# Patient Record
Sex: Female | Born: 1960 | Race: Black or African American | Hispanic: No | Marital: Married | Smoking: Former smoker
Health system: Southern US, Community
[De-identification: ages and names within clinical notes are randomized; demographics above are authoritative.]

## PROBLEM LIST (undated history)

## (undated) DIAGNOSIS — D649 Anemia, unspecified: Secondary | ICD-10-CM

## (undated) DIAGNOSIS — IMO0001 Reserved for inherently not codable concepts without codable children: Secondary | ICD-10-CM

## (undated) DIAGNOSIS — I428 Other cardiomyopathies: Secondary | ICD-10-CM

## (undated) DIAGNOSIS — Z87891 Personal history of nicotine dependence: Secondary | ICD-10-CM

## (undated) DIAGNOSIS — I5022 Chronic systolic (congestive) heart failure: Secondary | ICD-10-CM

## (undated) DIAGNOSIS — I10 Essential (primary) hypertension: Secondary | ICD-10-CM

## (undated) DIAGNOSIS — I480 Paroxysmal atrial fibrillation: Secondary | ICD-10-CM

## (undated) HISTORY — DX: Personal history of nicotine dependence: Z87.891

## (undated) HISTORY — DX: Anemia, unspecified: D64.9

## (undated) HISTORY — DX: Chronic systolic (congestive) heart failure: I50.22

## (undated) HISTORY — PX: LAPAROSCOPIC GASTRIC SLEEVE RESECTION: SHX5895

## (undated) HISTORY — DX: Morbid (severe) obesity due to excess calories: E66.01

## (undated) HISTORY — DX: Essential (primary) hypertension: I10

---

## 2013-06-27 ENCOUNTER — Other Ambulatory Visit: Payer: Self-pay

## 2013-06-27 DIAGNOSIS — Z1231 Encounter for screening mammogram for malignant neoplasm of breast: Secondary | ICD-10-CM

## 2013-06-30 ENCOUNTER — Ambulatory Visit
Admission: RE | Admit: 2013-06-30 | Discharge: 2013-06-30 | Disposition: A | Discharge status: Home / Self Care | Payer: No Typology Code available for payment source | Source: Ambulatory Visit

## 2013-06-30 ENCOUNTER — Encounter (INDEPENDENT_AMBULATORY_CARE_PROVIDER_SITE_OTHER): Payer: Self-pay

## 2013-06-30 DIAGNOSIS — Z1231 Encounter for screening mammogram for malignant neoplasm of breast: Secondary | ICD-10-CM

## 2014-12-10 ENCOUNTER — Encounter (HOSPITAL_COMMUNITY): Payer: Self-pay | Admitting: *Deleted

## 2014-12-10 ENCOUNTER — Observation Stay (HOSPITAL_COMMUNITY)
Admission: EM | Admit: 2014-12-10 | Discharge: 2014-12-11 | Disposition: A | Discharge status: Home / Self Care | Payer: BLUE CROSS/BLUE SHIELD | Attending: Cardiovascular Disease | Admitting: Cardiovascular Disease

## 2014-12-10 ENCOUNTER — Emergency Department (HOSPITAL_COMMUNITY): Payer: BLUE CROSS/BLUE SHIELD

## 2014-12-10 DIAGNOSIS — E669 Obesity, unspecified: Secondary | ICD-10-CM | POA: Diagnosis not present

## 2014-12-10 DIAGNOSIS — I429 Cardiomyopathy, unspecified: Secondary | ICD-10-CM | POA: Diagnosis not present

## 2014-12-10 DIAGNOSIS — I5022 Chronic systolic (congestive) heart failure: Secondary | ICD-10-CM | POA: Diagnosis not present

## 2014-12-10 DIAGNOSIS — I4891 Unspecified atrial fibrillation: Secondary | ICD-10-CM

## 2014-12-10 DIAGNOSIS — I480 Paroxysmal atrial fibrillation: Principal | ICD-10-CM | POA: Insufficient documentation

## 2014-12-10 DIAGNOSIS — I428 Other cardiomyopathies: Secondary | ICD-10-CM | POA: Diagnosis not present

## 2014-12-10 DIAGNOSIS — Z720 Tobacco use: Secondary | ICD-10-CM

## 2014-12-10 DIAGNOSIS — E876 Hypokalemia: Secondary | ICD-10-CM | POA: Diagnosis not present

## 2014-12-10 DIAGNOSIS — I110 Hypertensive heart disease with heart failure: Secondary | ICD-10-CM | POA: Diagnosis not present

## 2014-12-10 DIAGNOSIS — Z7982 Long term (current) use of aspirin: Secondary | ICD-10-CM | POA: Diagnosis not present

## 2014-12-10 DIAGNOSIS — Z6841 Body Mass Index (BMI) 40.0 and over, adult: Secondary | ICD-10-CM | POA: Diagnosis not present

## 2014-12-10 DIAGNOSIS — I10 Essential (primary) hypertension: Secondary | ICD-10-CM | POA: Diagnosis not present

## 2014-12-10 HISTORY — DX: Paroxysmal atrial fibrillation: I48.0

## 2014-12-10 HISTORY — DX: Other cardiomyopathies: I42.8

## 2014-12-10 LAB — BASIC METABOLIC PANEL
Anion gap: 6 (ref 5–15)
BUN: 16 mg/dL (ref 6–20)
CALCIUM: 8.4 mg/dL — AB (ref 8.9–10.3)
CO2: 27 mmol/L (ref 22–32)
CREATININE: 0.78 mg/dL (ref 0.44–1.00)
Chloride: 102 mmol/L (ref 101–111)
GFR calc non Af Amer: >60 mL/min (ref >60)
Glucose, Bld: 133 mg/dL — ABNORMAL HIGH (ref 65–99)
Potassium: 3 mmol/L — ABNORMAL LOW (ref 3.5–5.1)
SODIUM: 135 mmol/L (ref 135–145)

## 2014-12-10 LAB — CBC
HCT: 35 % — ABNORMAL LOW (ref 36.0–46.0)
Hemoglobin: 10.9 g/dL — ABNORMAL LOW (ref 12.0–15.0)
MCH: 24.8 pg — ABNORMAL LOW (ref 26.0–34.0)
MCHC: 31.1 g/dL (ref 30.0–36.0)
MCV: 79.7 fL (ref 78.0–100.0)
PLATELETS: 174 10*3/uL (ref 150–400)
RBC: 4.39 MIL/uL (ref 3.87–5.11)
RDW: 15.6 % — AB (ref 11.5–15.5)
WBC: 5.9 10*3/uL (ref 4.0–10.5)

## 2014-12-10 LAB — TROPONIN I

## 2014-12-10 LAB — TSH: TSH: 1.188 u[IU]/mL (ref 0.350–4.500)

## 2014-12-10 LAB — BRAIN NATRIURETIC PEPTIDE: B NATRIURETIC PEPTIDE 5: 492.6 pg/mL — AB (ref 0.0–100.0)

## 2014-12-10 LAB — I-STAT TROPONIN, ED: TROPONIN I, POC: 0.01 ng/mL (ref 0.00–0.08)

## 2014-12-10 MED ORDER — POTASSIUM CHLORIDE CRYS ER 20 MEQ PO TBCR
40.0000 meq | EXTENDED_RELEASE_TABLET | Freq: Once | ORAL | Status: AC
Start: 2014-12-10 — End: 2014-12-10
  Administered 2014-12-10: 40 meq via ORAL
  Filled 2014-12-10: qty 2

## 2014-12-10 MED ORDER — APIXABAN 5 MG PO TABS
5.0000 mg | ORAL_TABLET | Freq: Two times a day (BID) | ORAL | Status: DC
  Administered 2014-12-10 – 2014-12-11 (×3): 5 mg via ORAL
  Filled 2014-12-10 (×3): qty 1

## 2014-12-10 MED ORDER — ACETAMINOPHEN 325 MG PO TABS
650.0000 mg | ORAL_TABLET | ORAL | Status: DC | PRN
  Administered 2014-12-10: 650 mg via ORAL
  Filled 2014-12-10: qty 2

## 2014-12-10 MED ORDER — DILTIAZEM HCL 100 MG IV SOLR: 5.0000 mg/h | INTRAVENOUS | Status: DC

## 2014-12-10 MED ORDER — ONDANSETRON HCL 4 MG/2ML IJ SOLN: 4.0000 mg | Freq: Four times a day (QID) | INTRAMUSCULAR | Status: DC | PRN

## 2014-12-10 MED ORDER — DILTIAZEM LOAD VIA INFUSION
10.0000 mg | Freq: Once | INTRAVENOUS | Status: AC
  Administered 2014-12-10: 10 mg via INTRAVENOUS
  Filled 2014-12-10: qty 10

## 2014-12-10 MED ORDER — DILTIAZEM HCL 100 MG IV SOLR
5.0000 mg/h | INTRAVENOUS | Status: DC
  Administered 2014-12-10: 15 mg/h via INTRAVENOUS
  Administered 2014-12-10: 5 mg/h via INTRAVENOUS
  Filled 2014-12-10 (×4): qty 100

## 2014-12-10 NOTE — H&P (Signed)
Patient ID: Wanda Trujillo MRN: [id number], DOB/AGE: [birth year]   Admit date: 12/10/2014   Primary Physician: Egbert Garibaldi, NP Primary Cardiologist: New Reason for consult: new onset atrial fibrillation   Pt. Profile:  Wanda Trujillo is a 54 y.o. female with a history of systolic CHF (dx in 2010 in [place]), HTN, and obesity who presented to [facility] today with palpitations, chest tightness and SOB. She was found to be in afb with RVR. This is new onset.   She is originally from [place] and moved to [place] a couple of years ago. She established with a PCP: Dr. Fredrik Cove but not cardiology. She reports a history of NICM diagnosed in 2010 and long term therapy with lisinopril and coreg. She also took lasix for a short period of time but no longer takes it. She was a smoker but quit at the time of her CHF diagnosis. She also reports having had stress tests with no blockages. She denies ever having a cardiac catheterization.   She lost her job and insurance and was no longer able to get her ACE and BB. She was off them for two months. She recently got a new job and had an appointment to see her PCP tomorrow for refills. She was in her usual state of health until this past Friday when she was out drinking and smoking Hookah. While she was taking a drag of the hookah she had sudden on set of chest palpations. This was associated with chest tightness and SOB. Since Friday night she has had intermittent chest tightness, SOB and palpations prompting her to present to the ED today where she was found to be in afib with RVR. She was placed on a dilt gtt for rate control. This has helped her significantly but she still feels mildly SOB. No LE edema, PND or orthopnea. She does admit to exertional dyspnea lately.   Problem List  Past Medical History  Diagnosis Date  . CHF (congestive heart failure) (HCC)   . Hypertension     History reviewed. No pertinent past surgical history.    Allergies  No Known Allergies   Home Medications  Prior to Admission medications   Medication Sig Start Date End Date Taking? Authorizing Provider  aspirin 325 MG tablet Take 325 mg by mouth every 4 (four) hours as needed for mild pain or moderate pain.   Yes Historical Provider, MD    Family History  History reviewed. No pertinent family history. No family status information on file.     Social History  Social History   Social History  . Marital Status: Married    Spouse Name: N/A  . Number of Children: N/A  . Years of Education: N/A   Occupational History  . Not on file.   Social History Main Topics  . Smoking status: Never Smoker   . Smokeless tobacco: Not on file  . Alcohol Use: Yes     Comment: occ  . Drug Use: No  . Sexual Activity: Not on file   Other Topics Concern  . Not on file   Social History Narrative  . No narrative on file    . All other systems reviewed and are otherwise negative except as noted above.  Physical Exam  Blood pressure 133/78, pulse 115, temperature 97.9 F (36.6 C), temperature source Oral, resp. rate 17, height  (1.651 m), weight 247 lb (112.038 kg), last menstrual period 11/25/2014, SpO2 100 %.  General: Pleasant, NAD, obese Psych: Normal  affect. Neuro: Alert and oriented X 3. Moves all extremities spontaneously. HEENT: Normal  Neck: Supple without bruits or JVD. Lungs:  Resp regular and unlabored, CTA. Heart: irreg irreg no s3, s4, or murmurs. Abdomen: Soft, non-tender, non-distended, BS + x 4.  Extremities: No clubbing, cyanosis or edema. DP/PT/Radials 2+ and equal bilaterally.  Labs  No results for input(s): CKTOTAL, CKMB, TROPONINI in the last 72 hours. Lab Results  Component Value Date   WBC 5.9 12/10/2014   HGB 10.9* 12/10/2014   HCT 35.0* 12/10/2014   MCV 79.7 12/10/2014   PLT 174 12/10/2014    Recent Labs Lab 12/10/14 0729  NA 135  K 3.0*  CL 102  CO2 27  BUN 16  CREATININE 0.78   CALCIUM 8.4*  GLUCOSE 133*     Radiology/Studies  Dg Chest Portable 1 View  12/10/2014  CLINICAL DATA:  54 year old female with mid chest tightness and shortness of breath for the past 2 days. Headache and dizziness. EXAM: PORTABLE CHEST 1 VIEW COMPARISON:  No priors. FINDINGS: Lung volumes are normal. No consolidative airspace disease. No pleural effusions. No evidence of pulmonary edema. No pneumothorax. No suspicious appearing pulmonary nodules or masses. Mild cardiomegaly. The patient is rotated to the right on today's exam, resulting in distortion of the mediastinal contours and reduced diagnostic sensitivity and specificity for mediastinal pathology. IMPRESSION: 1. No radiographic evidence of acute cardiopulmonary disease. 2. Mild cardiomegaly. Electronically Signed   By: Trudie Reed M.D.   On: 12/10/2014 08:01    ECG  HR 133 Atrial fibrillation with rapid ventricular response  ASSESSMENT AND PLAN  Wanda Trujillo is a 54 y.o. female with a history of systolic CHF (dx in 2010 in [place]), HTN, and obesity who presented to [facility] today with palpitations, chest tightness and SOB. She was found to be in afb with RVR.  Afib with RVR: -- CHADSVASC score of at least 3 (1 HTN, 1 CHF, 1 Female sex). Will start her on Eliquis for thromboembolic  -- Plan for 2D ECHO when rate controlled or converted into NSR.  -- Will admit to tele. Consider TEE/DCCV tomorrow if she does not spontaneously convert. Will continue IV dilt for now. -- Will obtain a TSH and HgA1c  Hypokalemia- will supplement   HTN- will resume home Lisinopril   Hx of systolic CHF- she appears euvolemic -- Will obtain 2D ECHO as above to assess LV function   Signed, Janetta Hora, PA-C 12/10/2014, 10:07 AM  Pager [phone]  The patient was seen and examined, and I agree with the assessment and plan as documented above, with modifications as noted below. 54 yr old woman with aforementioned history (probable  nonischemic cardiomyopathy) who presents with rapid atrial fibrillation. Palpitations occurred after binge drinking and smoking Hookah. Had been taking meds until she ran out insurance, but recently obtained it again. Has appt with new PCP tomorrow to get med refills.  Currently on diltiazem infusion 10 mg/hr. HR had been in 90 bpm range but elevated after having to use the bathroom. SBP in 120 mmHg range.  RECOMMENDATIONS: Will continue IV diltiazem for now and plan for TEE/cardioversion on 10/17 if still in rapid atrial fibrillation. I discussed thromboembolic risk (CHA2DS2VASC of 3) with her and she agrees to take anticoagulant therapy. Will start apixaban 5 mg bid. K low at 3 (no known h/o hypokalemia) so will replete. Obtain echocardiogram once HR controlled and/or she converts to sinus rhythm.

## 2014-12-10 NOTE — ED Notes (Signed)
Pt reports mid chest tightness and sob since Friday. Also having headache and dizziness. ekg done, afib rvr 130-150.

## 2014-12-10 NOTE — ED Notes (Signed)
Attempted report 

## 2014-12-10 NOTE — ED Notes (Signed)
Cardiology, MD at bedside. 

## 2014-12-10 NOTE — ED Provider Notes (Signed)
CSN: [id number]     Arrival date & time 12/10/14  0712 History   First MD Initiated Contact with Patient 12/10/14 [phone]     Chief Complaint  Patient presents with  . Chest Pain  . Headache     (Consider location/radiation/quality/duration/timing/severity/associated sxs/prior Treatment) HPI Comments: Patient is a 54 year old female with history of hypertension and reported episode of congestive heart failure in 2010 area she presents today with complaints of tightness in her chest, feeling short of breath, and palpitations. This started 2 days ago while she was drinking alcohol and smoking hookah. She denies any fevers or chills. She denies any swelling of her legs. She has been treated in the past with Coreg and lisinopril, however she has been off of these for the past 2 months due to financial reasons.  Patient is a 54 y.o. female presenting with chest pain. The history is provided by the patient.  Chest Pain Pain location:  Substernal area Pain quality: tightness   Pain radiates to:  Does not radiate Pain radiates to the back: no   Pain severity:  Moderate Onset quality:  Sudden Duration:  2 days Timing:  Constant Progression:  Unchanged Chronicity:  New Context: breathing   Relieved by:  Nothing Worsened by:  Nothing tried Ineffective treatments:  None tried Associated symptoms: shortness of breath     Past Medical History  Diagnosis Date  . CHF (congestive heart failure) (HCC)   . Hypertension    History reviewed. No pertinent past surgical history. History reviewed. No pertinent family history. Social History  Substance Use Topics  . Smoking status: Never Smoker   . Smokeless tobacco: None  . Alcohol Use: Yes     Comment: occ   OB History    No data available     Review of Systems  Respiratory: Positive for shortness of breath.   Cardiovascular: Positive for chest pain.  All other systems reviewed and are negative.     Allergies  Review of patient's  allergies indicates no known allergies.  Home Medications   Prior to Admission medications   Not on File   BP 150/106 mmHg  Pulse 133  Temp(Src) 97.9 F (36.6 C) (Oral)  Resp 20  Ht  (1.651 m)  Wt 247 lb (112.038 kg)  BMI 41.10 kg/m2  SpO2 100% Physical Exam  Constitutional: She is oriented to person, place, and time. She appears well-developed and well-nourished. No distress.  HENT:  Head: Normocephalic and atraumatic.  Neck: Normal range of motion. Neck supple.  Cardiovascular: Exam reveals no gallop and no friction rub.   No murmur heard. Heart rate is irregularly irregular and rapid  Pulmonary/Chest: Effort normal and breath sounds normal. No respiratory distress. She has no wheezes. She has no rales.  Abdominal: Soft. Bowel sounds are normal. She exhibits no distension. There is no tenderness.  Musculoskeletal: Normal range of motion. She exhibits no edema.  Neurological: She is alert and oriented to person, place, and time.  Skin: Skin is warm and dry. She is not diaphoretic.  Nursing note and vitals reviewed.   ED Course  Procedures (including critical care time) Labs Review Labs Reviewed  BASIC METABOLIC PANEL  CBC  BRAIN NATRIURETIC PEPTIDE  I-STAT TROPOININ, ED    Imaging Review No results found. I have personally reviewed and evaluated these images and lab results as part of my medical decision-making.   EKG Interpretation   Date/Time:  Sunday December 10 2014 07:20:55 EDT Ventricular Rate:  133  PR Interval:    QRS Duration: 95 QT Interval:  320 QTC Calculation: 476 R Axis:   42 Text Interpretation:  Atrial fibrillation with rapid ventricular response  Confirmed by Mikesha Migliaccio  MD, Kiwan Gadsden ([id number]) on 12/10/2014 7:29:46 AM      MDM   Final diagnoses:  None    Patient presents with complaints of palpitations, tightness in her chest, and shortness of breath she appears to be related to atrial fibrillation with rapid ventricular response. Her  rate was initially in the 140s and was started on a Cardizem drip with good rate control. Laboratory studies reveal an elevated BNP, however chest x-ray reveals no evidence for overt CHF. I've spoken with the cardiologist on call, and the patient will be admitted to the cardiology service under the care of Dr. Purvis Sheffield.  CRITICAL CARE Performed by: Geoffery Lyons Total critical care time: 45 minutes Critical care time was exclusive of separately billable procedures and treating other patients. Critical care was necessary to treat or prevent imminent or life-threatening deterioration. Critical care was time spent personally by me on the following activities: development of treatment plan with patient and/or surrogate as well as nursing, discussions with consultants, evaluation of patient's response to treatment, examination of patient, obtaining history from patient or surrogate, ordering and performing treatments and interventions, ordering and review of laboratory studies, ordering and review of radiographic studies, pulse oximetry and re-evaluation of patient's condition.     Geoffery Lyons, MD 12/10/14 [phone]

## 2014-12-11 ENCOUNTER — Encounter (HOSPITAL_COMMUNITY)
Admission: EM | Disposition: A | Discharge status: Home / Self Care | Payer: Self-pay | Source: Home / Self Care | Attending: Emergency Medicine

## 2014-12-11 ENCOUNTER — Observation Stay (HOSPITAL_BASED_OUTPATIENT_CLINIC_OR_DEPARTMENT_OTHER): Payer: BLUE CROSS/BLUE SHIELD

## 2014-12-11 ENCOUNTER — Telehealth: Payer: Self-pay | Admitting: Physician Assistant

## 2014-12-11 ENCOUNTER — Encounter (HOSPITAL_COMMUNITY): Payer: Self-pay

## 2014-12-11 ENCOUNTER — Observation Stay (HOSPITAL_COMMUNITY): Payer: BLUE CROSS/BLUE SHIELD | Admitting: Anesthesiology

## 2014-12-11 DIAGNOSIS — I428 Other cardiomyopathies: Secondary | ICD-10-CM

## 2014-12-11 DIAGNOSIS — I4891 Unspecified atrial fibrillation: Secondary | ICD-10-CM | POA: Diagnosis not present

## 2014-12-11 DIAGNOSIS — I480 Paroxysmal atrial fibrillation: Secondary | ICD-10-CM | POA: Diagnosis present

## 2014-12-11 HISTORY — PX: CARDIOVERSION: SHX1299

## 2014-12-11 HISTORY — PX: TEE WITHOUT CARDIOVERSION: SHX5443

## 2014-12-11 LAB — PROTIME-INR
INR: 1.13 (ref 0.00–1.49)
PROTHROMBIN TIME: 14.6 s (ref 11.6–15.2)

## 2014-12-11 LAB — BASIC METABOLIC PANEL
ANION GAP: 6 (ref 5–15)
BUN: 9 mg/dL (ref 6–20)
CALCIUM: 8.4 mg/dL — AB (ref 8.9–10.3)
CO2: 25 mmol/L (ref 22–32)
Chloride: 106 mmol/L (ref 101–111)
Creatinine, Ser: 0.58 mg/dL (ref 0.44–1.00)
GFR calc Af Amer: >60 mL/min (ref >60)
Glucose, Bld: 112 mg/dL — ABNORMAL HIGH (ref 65–99)
POTASSIUM: 4.5 mmol/L (ref 3.5–5.1)
SODIUM: 137 mmol/L (ref 135–145)

## 2014-12-11 LAB — LIPID PANEL
CHOL/HDL RATIO: 2.8 ratio
CHOLESTEROL: 159 mg/dL (ref 0–200)
HDL: 57 mg/dL (ref >40)
LDL Cholesterol: 81 mg/dL (ref 0–99)
Triglycerides: 103 mg/dL (ref <150)
VLDL: 21 mg/dL (ref 0–40)

## 2014-12-11 LAB — CBC
HEMATOCRIT: 32.6 % — AB (ref 36.0–46.0)
HEMOGLOBIN: 10.2 g/dL — AB (ref 12.0–15.0)
MCH: 25.1 pg — ABNORMAL LOW (ref 26.0–34.0)
MCHC: 31.3 g/dL (ref 30.0–36.0)
MCV: 80.1 fL (ref 78.0–100.0)
Platelets: 152 10*3/uL (ref 150–400)
RBC: 4.07 MIL/uL (ref 3.87–5.11)
RDW: 15.7 % — AB (ref 11.5–15.5)
WBC: 6 10*3/uL (ref 4.0–10.5)

## 2014-12-11 LAB — HEMOGLOBIN A1C
Hgb A1c MFr Bld: 5.7 % — ABNORMAL HIGH (ref 4.8–5.6)
MEAN PLASMA GLUCOSE: 117 mg/dL

## 2014-12-11 LAB — TROPONIN I: Troponin I: <0.03 ng/mL (ref <0.031)

## 2014-12-11 SURGERY — ECHOCARDIOGRAM, TRANSESOPHAGEAL
Anesthesia: Monitor Anesthesia Care

## 2014-12-11 MED ORDER — DILTIAZEM HCL ER COATED BEADS 180 MG PO CP24
180.0000 mg | ORAL_CAPSULE | Freq: Every day | ORAL | Status: DC
  Administered 2014-12-11: 180 mg via ORAL
  Filled 2014-12-11: qty 1

## 2014-12-11 MED ORDER — PROPOFOL 500 MG/50ML IV EMUL
INTRAVENOUS | Status: DC | PRN
  Administered 2014-12-11: 150 ug/kg/min via INTRAVENOUS

## 2014-12-11 MED ORDER — MIDAZOLAM HCL 5 MG/ML IJ SOLN
INTRAMUSCULAR | Status: AC
  Filled 2014-12-11: qty 2

## 2014-12-11 MED ORDER — DILTIAZEM HCL ER COATED BEADS 180 MG PO CP24: 180.0000 mg | ORAL_CAPSULE | Freq: Every day | ORAL | Status: DC

## 2014-12-11 MED ORDER — BUTAMBEN-TETRACAINE-BENZOCAINE 2-2-14 % EX AERO
INHALATION_SPRAY | CUTANEOUS | Status: DC | PRN
  Administered 2014-12-11: 2 via TOPICAL

## 2014-12-11 MED ORDER — CARVEDILOL 3.125 MG PO TABS: 3.1250 mg | ORAL_TABLET | Freq: Two times a day (BID) | ORAL | Status: DC

## 2014-12-11 MED ORDER — OFF THE BEAT BOOK
Freq: Once | Status: DC
  Filled 2014-12-11: qty 1

## 2014-12-11 MED ORDER — LISINOPRIL 5 MG PO TABS: 5.0000 mg | ORAL_TABLET | Freq: Every day | ORAL | Status: DC

## 2014-12-11 MED ORDER — LACTATED RINGERS IV SOLN
INTRAVENOUS | Status: DC | PRN
  Administered 2014-12-11: 11:00:00 via INTRAVENOUS

## 2014-12-11 MED ORDER — SODIUM CHLORIDE 0.9 % IV SOLN: INTRAVENOUS | Status: DC

## 2014-12-11 MED ORDER — FENTANYL CITRATE (PF) 100 MCG/2ML IJ SOLN
INTRAMUSCULAR | Status: AC
  Filled 2014-12-11: qty 2

## 2014-12-11 MED ORDER — LISINOPRIL 2.5 MG PO TABS: 5.0000 mg | ORAL_TABLET | Freq: Every day | ORAL | Status: DC

## 2014-12-11 MED ORDER — APIXABAN 5 MG PO TABS: 5.0000 mg | ORAL_TABLET | Freq: Two times a day (BID) | ORAL | Status: DC

## 2014-12-11 MED ORDER — PROPOFOL 10 MG/ML IV BOLUS
INTRAVENOUS | Status: DC | PRN
  Administered 2014-12-11 (×3): 20 mg via INTRAVENOUS

## 2014-12-11 NOTE — Transfer of Care (Signed)
Immediate Anesthesia Transfer of Care Note  Patient: Wanda Trujillo  Procedure(s) Performed: Procedure(s): TRANSESOPHAGEAL ECHOCARDIOGRAM (TEE) (N/A) CARDIOVERSION (N/A)  Patient Location: PACU  Anesthesia Type:MAC  Level of Consciousness: awake, oriented and patient cooperative  Airway & Oxygen Therapy: Patient Spontanous Breathing and Patient connected to nasal cannula oxygen  Post-op Assessment: Report given to RN, Post -op Vital signs reviewed and stable and Patient moving all extremities  Post vital signs: Reviewed and stable  Last Vitals:  Filed Vitals:   12/11/14 1133  BP: 147/90  Pulse: 73  Temp:   Resp: 19    Complications: No apparent anesthesia complications

## 2014-12-11 NOTE — Anesthesia Procedure Notes (Signed)
Procedure Name: MAC Date/Time: 12/11/2014 11:10 AM Performed by: Charm Barges, Iceis Knab R Pre-anesthesia Checklist: Patient identified, Emergency Drugs available, Suction available, Patient being monitored and Timeout performed Patient Re-evaluated:Patient Re-evaluated prior to inductionOxygen Delivery Method: Nasal cannula

## 2014-12-11 NOTE — Telephone Encounter (Signed)
TCM per [facility] Course  10/27 @ 10 w/ Kriste Basque

## 2014-12-11 NOTE — Progress Notes (Signed)
Echocardiogram 2D Echocardiogram has been performed.  Wanda Trujillo 12/11/2014, 1:43 PM

## 2014-12-11 NOTE — Procedures (Signed)
Electrical Cardioversion Procedure Note Geralda Frasier [id number] 1960/07/31  Procedure: Electrical Cardioversion Indications:  Atrial Fibrillation  Procedure Details Consent: Risks of procedure as well as the alternatives and risks of each were explained to the (patient/caregiver).  Consent for procedure obtained. Time Out: Verified patient identification, verified procedure, site/side was marked, verified correct patient position, special equipment/implants available, medications/allergies/relevent history reviewed, required imaging and test results available.  Performed  Patient placed on cardiac monitor, pulse oximetry, supplemental oxygen as necessary.  Sedation given: propofol Pacer pads placed anterior and posterior chest.  Cardioverted 1 time(s).  Cardioverted successfully at 150J.  Evaluation Findings: Post procedure EKG shows: NSR Complications: None Patient did tolerate procedure well.   Madilyn Hook, MD 12/11/2014, 6:00 PM

## 2014-12-11 NOTE — Care Management Note (Addendum)
Case Management Note  Patient Details  Name: Wanda Trujillo MRN: [id number] Date of Birth: 05-20-1960  Subjective/Objective:Pt admitted for A Fib. Pt will be d/c with the medication Eliquis.                   Action/Plan: Benefits check in process. Will make pt aware of cost once completed. CM will provide pt with 30 day free and co pay card. MD please write Rx for 30 day free no refills and then the original Rx with refills. No further needs from CM at this time.    Expected Discharge Date:                  Expected Discharge Plan:  Home/Self Care  In-House Referral:  NA  Discharge planning Services  CM Consult, Medication Assistance  Post Acute Care Choice:  NA Choice offered to:  NA  DME Arranged:  N/A DME Agency:  NA  HH Arranged:  NA HH Agency:  NA  Status of Service:     Medicare Important Message Given:    Date Medicare IM Given:    Medicare IM give by:    Date Additional Medicare IM Given:    Additional Medicare Important Message give by:     If discussed at Long Length of Stay Meetings, dates discussed:    Additional Comments:  Tomi Bamberger, RN, BSN [phone]  Eliquis:  Pt copay will be $50-prior auth not required. Pt wants to use [facility] on [address]. CM will call to make sure medication  Is available. CM did call and medication is available. No further needs from CM at this time.   Gala Lewandowsky, RN 12/11/2014, 10:52 AM

## 2014-12-11 NOTE — Progress Notes (Signed)
Patient Name: Wanda Trujillo Date of Encounter: 12/11/2014     Active Problems:   Atrial fibrillation (HCC)    SUBJECTIVE  No CP or SOB. Not feeling back to her baseline   CURRENT MEDS . apixaban  5 mg Oral BID  . off the beat book   Does not apply Once    OBJECTIVE  Filed Vitals:   12/10/14 1401 12/10/14 1544 12/10/14 2240 12/11/14 0533  BP: 145/72 123/64 124/67 129/64  Pulse:   83 50  Temp:   98.5 F (36.9 C) 97.4 F (36.3 C)  TempSrc:   Oral   Resp:   18 16  Height:      Weight:    256 lb 4.8 oz (116.257 kg)  SpO2:   100% 100%   No intake or output data in the 24 hours ending 12/11/14 0743 Filed Weights   12/10/14 0725 12/10/14 1311 12/11/14 0533  Weight: 247 lb (112.038 kg) 253 lb 9.6 oz (115.032 kg) 256 lb 4.8 oz (116.257 kg)    PHYSICAL EXAM   General: Pleasant, NAD, obese Psych: Normal affect. Neuro: Alert and oriented X 3. Moves all extremities spontaneously. HEENT: Normal Neck: Supple without bruits or JVD. Lungs: Resp regular and unlabored, CTA. Heart: irreg irreg no s3, s4, or murmurs. Abdomen: Soft, non-tender, non-distended, BS + x 4.  Extremities: No clubbing, cyanosis or edema. DP/PT/Radials 2+ and equal bilaterally.  Accessory Clinical Findings  CBC  Recent Labs  12/10/14 0729 12/11/14 0114  WBC 5.9 6.0  HGB 10.9* 10.2*  HCT 35.0* 32.6*  MCV 79.7 80.1  PLT 174 152   Basic Metabolic Panel  Recent Labs  12/10/14 0729 12/11/14 0114  NA 135 137  K 3.0* 4.5  CL 102 106  CO2 27 25  GLUCOSE 133* 112*  BUN 16 9  CREATININE 0.78 0.58  CALCIUM 8.4* 8.4*    Cardiac Enzymes  Recent Labs  12/10/14 1520 12/10/14 1902 12/11/14 0114  TROPONINI <0.03 <0.03 <0.03   Fasting Lipid Panel  Recent Labs  12/11/14 0114  CHOL 159  HDL 57  LDLCALC 81  TRIG 103  CHOLHDL 2.8   Thyroid Function Tests  Recent Labs  12/10/14 1520  TSH 1.188    TELE  afib with CVR. Some bradycardia overnight w/ up to 2.15  second pauses.   Radiology/Studies  Dg Chest Portable 1 View  12/10/2014  CLINICAL DATA:  54 year old female with mid chest tightness and shortness of breath for the past 2 days. Headache and dizziness. EXAM: PORTABLE CHEST 1 VIEW COMPARISON:  No priors. FINDINGS: Lung volumes are normal. No consolidative airspace disease. No pleural effusions. No evidence of pulmonary edema. No pneumothorax. No suspicious appearing pulmonary nodules or masses. Mild cardiomegaly. The patient is rotated to the right on today's exam, resulting in distortion of the mediastinal contours and reduced diagnostic sensitivity and specificity for mediastinal pathology. IMPRESSION: 1. No radiographic evidence of acute cardiopulmonary disease. 2. Mild cardiomegaly. Electronically Signed   By: Trudie Reed M.D.   On: 12/10/2014 08:01    ASSESSMENT AND PLAN  Wanda Trujillo is a 54 y.o. female with a history of systolic CHF (dx in 2010 in [place]), HTN, and obesity who presented to [facility] on 12/10/14 with palpitations, chest tightness and SOB. She was found to be in afb with RVR.  New onset Afib with RVR: she remains in atrial fibrillation with CVR on dilt gtt. I will convert her to PO Cardizem CD  now.  -- CHADSVASC score  of at least 3 (1 HTN, 1 CHF, 1 Female sex). Started on Eliquis for thromboembolic prophylaxis  -- Plan for 2D ECHO when rate controlled or converted into NSR.  -- I have arranged TEE/DCCV today at 11. She remains NPO -- TSH normal and HgA1c pending  Hypokalemia- resolved after supplementation   HTN- Placed on Cardizem CD 180 mg. Will add back her home lisinopril at discharge if BP will allow.  Hx of systolic CHF- sounds like NICM from her report. She appears euvolemic -- Will obtain 2D ECHO as above to assess LV function   Signed, Janetta Hora PA-C  Pager [phone] Patient seen and examined. I agree with the assessment and plan as detailed above. See also my additional thoughts below.    I spoke with the patient about the TEE cardioversion.  Plan for today.  Willa Rough, MD, [facility] 12/11/2014 9:52 AM

## 2014-12-11 NOTE — Anesthesia Postprocedure Evaluation (Signed)
  Anesthesia Post-op Note  Patient: Wanda Trujillo  Procedure(s) Performed: Procedure(s): TRANSESOPHAGEAL ECHOCARDIOGRAM (TEE) (N/A) CARDIOVERSION (N/A)  Patient Location: PACU  Anesthesia Type:MAC  Level of Consciousness: awake, alert , oriented and patient cooperative  Airway and Oxygen Therapy: Patient Spontanous Breathing  Post-op Pain: none  Post-op Assessment: Post-op Vital signs reviewed, Patient's Cardiovascular Status Stable, Respiratory Function Stable, Patent Airway and No signs of Nausea or vomiting              Post-op Vital Signs: stable  Last Vitals:  Filed Vitals:   12/11/14 1135  BP: 147/90  Pulse: 72  Temp:   Resp: 20    Complications: No apparent anesthesia complications

## 2014-12-11 NOTE — Progress Notes (Signed)
  Echocardiogram Echocardiogram Transesophageal has been performed.  Wanda Trujillo 12/11/2014, 12:07 PM

## 2014-12-11 NOTE — Discharge Instructions (Signed)

## 2014-12-11 NOTE — CV Procedure (Signed)
Brief TEE Note   Trivial MR, TR, PR. LVEF moderately reduced.  Recommend getting a transthoracic echo in sinus rhythm for accurate LVEF assessment.  Wanda Lenhardt C. Duke Salvia, MD  12/11/2014 11:29 AM

## 2014-12-11 NOTE — Anesthesia Preprocedure Evaluation (Signed)
Anesthesia Evaluation  Patient identified by MRN, date of birth, ID band Patient awake    Airway Mallampati: I       Dental   Pulmonary    Pulmonary exam normal        Cardiovascular hypertension, +CHF  + dysrhythmias Atrial Fibrillation  Rhythm:Irregular Rate:Abnormal     Neuro/Psych    GI/Hepatic   Endo/Other    Renal/GU      Musculoskeletal   Abdominal   Peds  Hematology   Anesthesia Other Findings   Reproductive/Obstetrics                             Anesthesia Physical Anesthesia Plan  ASA: III  Anesthesia Plan: MAC and General   Post-op Pain Management:    Induction: Intravenous  Airway Management Planned: Mask  Additional Equipment:   Intra-op Plan:   Post-operative Plan:   Informed Consent: I have reviewed the patients History and Physical, chart, labs and discussed the procedure including the risks, benefits and alternatives for the proposed anesthesia with the patient or authorized representative who has indicated his/her understanding and acceptance.     Plan Discussed with: CRNA, Anesthesiologist and Surgeon  Anesthesia Plan Comments:         Anesthesia Quick Evaluation

## 2014-12-11 NOTE — Discharge Summary (Signed)
Discharge Summary   Patient ID: Wanda Trujillo MRN: [id number], DOB/AGE: 1960/06/23 54 y.o. Admit date: 12/10/2014 D/C date:     12/11/2014  Primary Cardiologist: New (seen by Dr. Collins Trujillo but wants to follow in [place])  Principal Problem:   PAF (paroxysmal atrial fibrillation) (HCC) Active Problems:   Hypertension   NICM (nonischemic cardiomyopathy) (HCC)   Obesity    Admission Dates: 12/10/14-12/11/14 Discharge Diagnosis: New onset atrial fibrillation   HPI: Wanda Trujillo is a 54 y.o. female with a history of systolic CHF (dx in 2010 in [place]), HTN, and obesity who presented to [facility] on 12/10/14 with palpitations, chest tightness and SOB. She was found to be in afb with RVR.  She is originally from [place] and moved to [place] a couple of years ago. She established with a PCP: Dr. Fredrik Trujillo but not cardiology. She reports a history of NICM diagnosed in 2010 and long term therapy with lisinopril and coreg. She also took lasix for a short period of time but no longer takes it. She was a smoker but quit at the time of her CHF diagnosis in 2010. She also reports having had stress tests with no blockages. She denies ever having a cardiac catheterization.   She lost her job and insurance and was no longer able to get her ACE and BB. She was off them for two months. She recently got a new job and had an appointment to see her PCP tomorrow for refills. She was in her usual state of health until this past Friday when she was out drinking and smoking Hookah. While she was taking a drag of the hookah she had sudden on set of chest palpations. This was associated with chest tightness and SOB. Since that time she had intermittent chest tightness, SOB and palpations prompting her to present to the ED on 12/10/14 where she was found to be in afib with RVR.   Hospital Course  New onset Afib with RVR: she was placed on a dilt gtt for rate control and later converted to PO Cardizem CD     -- CHADSVASC score of at least 3 (1 HTN, 1 CHF, 1 Female sex). Started on Eliquis for thromboembolic prophylaxis -- She underwent successful TEE/DCCV on 12/11/14 and is currently maintaining NSR.  -- TSH normal and HgA1c 5.9 -- 2D ECHO with EF 40-45%, mild concentric hypertrophy. Diffuse hypokinesis. There is akinesis of the basalinferior myocardium.  Hypokalemia- resolved after supplementation   HTN- Placed on Cardizem CD 180 mg. Will add back her home lisinopril and coreg today. She will follow her BPs at home and call us if not well controlled ( too high or too low)  Hx of systolic CHF- sounds like NICM from her report. She said she has had negative stress tests in the past but no cardiac cath. We may want to think about doing outpatient ischemic eval at some point to confirm this.   -- 2D ECHO with EF 40-45%, mild concentric hypertrophy. Diffuse hypokinesis. There is akinesis of the basalinferior myocardium. -- Will resume coreg 3.125mg  BID and lisinopril . She appears euvolemic and does not require long term lasix.     The patient has had an uncomplicated hospital course and is recovering well. She has been seen by Dr. Myrtis Trujillo today and deemed ready for discharge home. All follow-up appointments have been scheduled.  A work excuse note was provided as well. Discharge medications are listed below.   Discharge Vitals: Blood pressure 138/91, pulse 75, temperature  98.5 F (36.9 C), temperature source Oral, resp. rate 18, height  (1.651 m), weight 256 lb 4.8 oz (116.257 kg), last menstrual period 11/25/2014, SpO2 100 %.  Labs: Lab Results  Component Value Date   WBC 6.0 12/11/2014   HGB 10.2* 12/11/2014   HCT 32.6* 12/11/2014   MCV 80.1 12/11/2014   PLT 152 12/11/2014     Recent Labs Lab 12/11/14 0114  NA 137  K 4.5  CL 106  CO2 25  BUN 9  CREATININE 0.58  CALCIUM 8.4*  GLUCOSE 112*    Recent Labs  12/10/14 1520 12/10/14 1902 12/11/14 0114  TROPONINI  <0.03 <0.03 <0.03   Lab Results  Component Value Date   CHOL 159 12/11/2014   HDL 57 12/11/2014   LDLCALC 81 12/11/2014   TRIG 103 12/11/2014   No results found for: DDIMER  Diagnostic Studies/Procedures   Dg Chest Portable 1 View  12/10/2014  CLINICAL DATA:  54 year old female with mid chest tightness and shortness of breath for the past 2 days. Headache and dizziness. EXAM: PORTABLE CHEST 1 VIEW COMPARISON:  No priors. FINDINGS: Lung volumes are normal. No consolidative airspace disease. No pleural effusions. No evidence of pulmonary edema. No pneumothorax. No suspicious appearing pulmonary nodules or masses. Mild cardiomegaly. The patient is rotated to the right on today's exam, resulting in distortion of the mediastinal contours and reduced diagnostic sensitivity and specificity for mediastinal pathology. IMPRESSION: 1. No radiographic evidence of acute cardiopulmonary disease. 2. Mild cardiomegaly. Electronically Signed   By: Trudie Reed M.D.   On: 12/10/2014 08:01     2D ECHO 12/11/2014 LV EF: 40% -  45% Study Conclusions - Left ventricle: The cavity size was normal. There was mild concentric hypertrophy. Systolic function was mildly to moderately reduced. The estimated ejection fraction was in the range of 40% to 45%. Diffuse hypokinesis. There is akinesis of the basalinferior myocardium. - Aortic valve: Trileaflet; normal thickness, mildly calcified leaflets. - Mitral valve: There was trivial regurgitation.   Discharge Medications     Medication List    STOP taking these medications        aspirin 325 MG tablet      TAKE these medications        apixaban 5 MG Tabs tablet  Commonly known as:  ELIQUIS  Take 1 tablet (5 mg total) by mouth 2 (two) times daily.     carvedilol 3.125 MG tablet  Commonly known as:  COREG  Take 1 tablet (3.125 mg total) by mouth 2 (two) times daily with a meal.     diltiazem 180 MG 24 hr capsule  Commonly known as:   CARDIZEM CD  Take 1 capsule (180 mg total) by mouth daily.     lisinopril 5 MG tablet  Commonly known as:  ZESTRIL  Take 1 tablet (5 mg total) by mouth daily.        Disposition   The patient will be discharged in stable condition to home.  Follow-up Information    Follow up with Ronie Spies, PA-C On 12/21/2014.   Specialties:  Cardiology, Radiology   Why:  @ 10am   Contact information:   [address] [place] [address] [phone]         Duration of Discharge Encounter: Greater than 30 minutes including physician and PA time.  SignedCline Crock R PA-C 12/11/2014, 5:14 PM Patient seen and examined. I agree with the assessment and plan as detailed above. See also  my additional thoughts below.   Refer to the progress note from earlier today. The patient is now stable for discharge. I agree completely with the discharge note outlined above and the plans for follow-up made.  Willa Rough, MD, [facility] 12/12/2014 6:35 AM

## 2014-12-12 ENCOUNTER — Encounter (HOSPITAL_COMMUNITY): Payer: Self-pay | Admitting: Cardiovascular Disease

## 2014-12-12 NOTE — Telephone Encounter (Signed)
Pt confirmed understanding of d/c instructions and is aware of f/u apt with Dayna Dunn on 12/21/14 at 10 am

## 2014-12-20 ENCOUNTER — Encounter: Payer: Self-pay | Admitting: Physician Assistant

## 2014-12-20 DIAGNOSIS — D649 Anemia, unspecified: Secondary | ICD-10-CM | POA: Insufficient documentation

## 2014-12-20 DIAGNOSIS — I5042 Chronic combined systolic (congestive) and diastolic (congestive) heart failure: Secondary | ICD-10-CM | POA: Insufficient documentation

## 2014-12-20 DIAGNOSIS — Z87891 Personal history of nicotine dependence: Secondary | ICD-10-CM | POA: Insufficient documentation

## 2014-12-20 DIAGNOSIS — I10 Essential (primary) hypertension: Secondary | ICD-10-CM | POA: Insufficient documentation

## 2014-12-20 NOTE — Progress Notes (Deleted)
Erroneous encounter

## 2014-12-21 ENCOUNTER — Encounter: Payer: BLUE CROSS/BLUE SHIELD | Admitting: Physician Assistant

## 2014-12-21 NOTE — Progress Notes (Signed)
This encounter was created in error - please disregard.

## 2014-12-26 ENCOUNTER — Encounter: Payer: Self-pay | Admitting: Physician Assistant

## 2015-06-18 ENCOUNTER — Encounter (HOSPITAL_COMMUNITY): Payer: Self-pay

## 2015-06-18 ENCOUNTER — Emergency Department (HOSPITAL_COMMUNITY): Payer: Self-pay

## 2015-06-18 ENCOUNTER — Inpatient Hospital Stay (HOSPITAL_COMMUNITY)
Admission: EM | Admit: 2015-06-18 | Discharge: 2015-06-21 | DRG: 308 | Disposition: A | Discharge status: Home / Self Care | Payer: Self-pay | Attending: Cardiology | Admitting: Cardiology

## 2015-06-18 DIAGNOSIS — I4891 Unspecified atrial fibrillation: Secondary | ICD-10-CM | POA: Diagnosis present

## 2015-06-18 DIAGNOSIS — I5042 Chronic combined systolic (congestive) and diastolic (congestive) heart failure: Secondary | ICD-10-CM | POA: Diagnosis present

## 2015-06-18 DIAGNOSIS — I10 Essential (primary) hypertension: Secondary | ICD-10-CM | POA: Diagnosis present

## 2015-06-18 DIAGNOSIS — D509 Iron deficiency anemia, unspecified: Secondary | ICD-10-CM

## 2015-06-18 DIAGNOSIS — Z9114 Patient's other noncompliance with medication regimen: Secondary | ICD-10-CM

## 2015-06-18 DIAGNOSIS — I429 Cardiomyopathy, unspecified: Secondary | ICD-10-CM

## 2015-06-18 DIAGNOSIS — I110 Hypertensive heart disease with heart failure: Secondary | ICD-10-CM | POA: Diagnosis present

## 2015-06-18 DIAGNOSIS — I480 Paroxysmal atrial fibrillation: Principal | ICD-10-CM | POA: Diagnosis present

## 2015-06-18 DIAGNOSIS — Z87891 Personal history of nicotine dependence: Secondary | ICD-10-CM

## 2015-06-18 DIAGNOSIS — I428 Other cardiomyopathies: Secondary | ICD-10-CM

## 2015-06-18 DIAGNOSIS — I5043 Acute on chronic combined systolic (congestive) and diastolic (congestive) heart failure: Secondary | ICD-10-CM | POA: Diagnosis present

## 2015-06-18 DIAGNOSIS — Z7901 Long term (current) use of anticoagulants: Secondary | ICD-10-CM

## 2015-06-18 DIAGNOSIS — Z6841 Body Mass Index (BMI) 40.0 and over, adult: Secondary | ICD-10-CM

## 2015-06-18 HISTORY — DX: Reserved for inherently not codable concepts without codable children: IMO0001

## 2015-06-18 LAB — CBC
HEMATOCRIT: 34.8 % — AB (ref 36.0–46.0)
HEMOGLOBIN: 10.6 g/dL — AB (ref 12.0–15.0)
MCH: 23.7 pg — AB (ref 26.0–34.0)
MCHC: 30.5 g/dL (ref 30.0–36.0)
MCV: 77.9 fL — AB (ref 78.0–100.0)
Platelets: 157 10*3/uL (ref 150–400)
RBC: 4.47 MIL/uL (ref 3.87–5.11)
RDW: 16.2 % — ABNORMAL HIGH (ref 11.5–15.5)
WBC: 4.6 10*3/uL (ref 4.0–10.5)

## 2015-06-18 LAB — HEPATIC FUNCTION PANEL
ALBUMIN: 3.2 g/dL — AB (ref 3.5–5.0)
ALK PHOS: 68 U/L (ref 38–126)
ALT: 30 U/L (ref 14–54)
AST: 55 U/L — AB (ref 15–41)
BILIRUBIN TOTAL: 0.7 mg/dL (ref 0.3–1.2)
Total Protein: 6.9 g/dL (ref 6.5–8.1)

## 2015-06-18 LAB — BASIC METABOLIC PANEL
Anion gap: 10 (ref 5–15)
BUN: 12 mg/dL (ref 6–20)
CHLORIDE: 108 mmol/L (ref 101–111)
CO2: 24 mmol/L (ref 22–32)
CREATININE: 0.87 mg/dL (ref 0.44–1.00)
Calcium: 8.8 mg/dL — ABNORMAL LOW (ref 8.9–10.3)
GFR calc non Af Amer: >60 mL/min (ref >60)
Glucose, Bld: 111 mg/dL — ABNORMAL HIGH (ref 65–99)
POTASSIUM: 3.5 mmol/L (ref 3.5–5.1)
SODIUM: 142 mmol/L (ref 135–145)

## 2015-06-18 LAB — TROPONIN I
Troponin I: <0.03 ng/mL (ref <0.031)
Troponin I: <0.03 ng/mL (ref <0.031)

## 2015-06-18 LAB — TSH: TSH: 1.172 u[IU]/mL (ref 0.350–4.500)

## 2015-06-18 LAB — BRAIN NATRIURETIC PEPTIDE: B Natriuretic Peptide: 428.7 pg/mL — ABNORMAL HIGH (ref 0.0–100.0)

## 2015-06-18 MED ORDER — APIXABAN 5 MG PO TABS
5.0000 mg | ORAL_TABLET | Freq: Two times a day (BID) | ORAL | Status: DC
  Administered 2015-06-18 – 2015-06-21 (×6): 5 mg via ORAL
  Filled 2015-06-18 (×8): qty 1

## 2015-06-18 MED ORDER — DILTIAZEM HCL 100 MG IV SOLR
5.0000 mg/h | INTRAVENOUS | Status: DC
  Administered 2015-06-18: 5 mg/h via INTRAVENOUS
  Administered 2015-06-19: 10 mg/h via INTRAVENOUS
  Administered 2015-06-19: 7.5 mg/h via INTRAVENOUS
  Administered 2015-06-20: 12.5 mg/h via INTRAVENOUS
  Filled 2015-06-18 (×6): qty 100

## 2015-06-18 MED ORDER — ONDANSETRON HCL 4 MG/2ML IJ SOLN: 4.0000 mg | Freq: Four times a day (QID) | INTRAMUSCULAR | Status: DC | PRN

## 2015-06-18 MED ORDER — ASPIRIN 81 MG PO CHEW
324.0000 mg | CHEWABLE_TABLET | Freq: Once | ORAL | Status: AC
  Administered 2015-06-18: 324 mg via ORAL
  Filled 2015-06-18: qty 4

## 2015-06-18 MED ORDER — DILTIAZEM HCL 25 MG/5ML IV SOLN: 10.0000 mg | Freq: Once | INTRAVENOUS | Status: DC

## 2015-06-18 MED ORDER — ACETAMINOPHEN 325 MG PO TABS: 650.0000 mg | ORAL_TABLET | ORAL | Status: DC | PRN

## 2015-06-18 MED ORDER — LISINOPRIL 5 MG PO TABS
5.0000 mg | ORAL_TABLET | Freq: Every day | ORAL | Status: DC
  Administered 2015-06-18 – 2015-06-21 (×4): 5 mg via ORAL
  Filled 2015-06-18 (×4): qty 1

## 2015-06-18 MED ORDER — DILTIAZEM LOAD VIA INFUSION
10.0000 mg | Freq: Once | INTRAVENOUS | Status: AC
  Administered 2015-06-18: 10 mg via INTRAVENOUS
  Filled 2015-06-18: qty 10

## 2015-06-18 NOTE — Care Management (Addendum)
CM consulted for assistance with PCP, and medications, CM met with patient she states that she is without medical insurance, or a PCP. Discussed the [facility], patient is agreeable with establishing primary care at the clinic. Appt scheduled 5/2 at 3:30p.

## 2015-06-18 NOTE — ED Provider Notes (Signed)
CSN: [id number]     Arrival date & time 06/18/15  1358 History   First MD Initiated Contact with Patient 06/18/15 1510     Chief Complaint  Patient presents with  . Chest Pain     (Consider location/radiation/quality/duration/timing/severity/associated sxs/prior Treatment) HPI   55 year old female with past medical history of hypertension, CHF, and history of atrial fibrillation he presents with a week and a half of palpitations and shortness of breath. The patient states that she has been out of her medications for at least several weeks due to inability to afford them. Approximately a week and a half ago, upon standing up she noticed acute onset of palpitations, substernal chest pressure, and shortness of breath. Since then, she has noticed daily, constant palpitations as well as shortness of breath with exertion. She also adamantly gets a dull, substernal chest pain with exertion. Denies any fevers or chills. She does have some mild cough and dyspnea on exertion as well as orthopnea during this time period. Denies any significant lower extremity edema. No fevers or chills. No recent medication changes.  Past Medical History  Diagnosis Date  . NICM (nonischemic cardiomyopathy) (HCC)     a. Dx in 2010 in [place] - no prior cath; patient reports prior negative stress tests. b. 2D ECHO with EF 40-45%, mild concentric hypertrophy. Diffuse hypokinesis. There is akinesis of the basal inferior myocardium.  . Essential hypertension   . PAF (paroxysmal atrial fibrillation) (HCC)     a. new dx on 11/2014 admission. s/p TEE/DCCV. placed on Eliquis  . Morbid obesity (HCC)   . Anemia     a. Noted on labs 11/2014, no prior to compare to.  . Chronic systolic CHF (congestive heart failure) (HCC)   . Former tobacco use    Past Surgical History  Procedure Laterality Date  . Laparoscopic gastric sleeve resection N/A     2012  . Tee without cardioversion N/A 12/11/2014    Procedure: TRANSESOPHAGEAL  ECHOCARDIOGRAM (TEE);  Surgeon: Chilton Si, MD;  Location: [facility] ENDOSCOPY;  Service: Cardiovascular;  Laterality: N/A;  . Cardioversion N/A 12/11/2014    Procedure: CARDIOVERSION;  Surgeon: Chilton Si, MD;  Location: [facility] ENDOSCOPY;  Service: Cardiovascular;  Laterality: N/A;   Family History  Problem Relation Age of Onset  . Hypertension Mother    Social History  Substance Use Topics  . Smoking status: Never Smoker   . Smokeless tobacco: None  . Alcohol Use: Yes     Comment: occ   OB History    No data available     Review of Systems  Constitutional: Positive for chills. Negative for fever.  HENT: Negative for congestion and rhinorrhea.   Eyes: Negative for visual disturbance.  Respiratory: Positive for shortness of breath. Negative for cough and wheezing.   Cardiovascular: Positive for chest pain and palpitations. Negative for leg swelling.  Gastrointestinal: Negative for nausea, abdominal pain and diarrhea.  Genitourinary: Negative for flank pain.  Skin: Negative for rash.  Allergic/Immunologic: Negative for immunocompromised state.  Neurological: Negative for syncope, weakness and headaches.      Allergies  Review of patient's allergies indicates no known allergies.  Home Medications   Prior to Admission medications   Medication Sig Start Date End Date Taking? Authorizing Provider  apixaban (ELIQUIS) 5 MG TABS tablet Take 1 tablet (5 mg total) by mouth 2 (two) times daily. Patient not taking: Reported on 06/18/2015 12/11/14   Janetta Hora, PA-C  carvedilol (COREG) 3.125 MG tablet Take 1 tablet (  3.125 mg total) by mouth 2 (two) times daily with a meal. Patient not taking: Reported on 06/18/2015 12/11/14   Janetta Hora, PA-C  diltiazem (CARDIZEM CD) 180 MG 24 hr capsule Take 1 capsule (180 mg total) by mouth daily. Patient not taking: Reported on 06/18/2015 12/11/14   Janetta Hora, PA-C  lisinopril (PRINIVIL,ZESTRIL) 5 MG tablet Take 1 tablet (5  mg total) by mouth daily. Patient not taking: Reported on 06/18/2015 12/11/14   Janetta Hora, PA-C   BP 127/78 mmHg  Pulse 92  Temp(Src) 98.2 F (36.8 C) (Oral)  Resp 18  Ht 5\' 4"  (1.626 m)  Wt 122.244 kg  BMI 46.24 kg/m2  SpO2 100%  LMP 05/25/2015 Physical Exam  Constitutional: She is oriented to person, place, and time. She appears well-developed and well-nourished. No distress.  HENT:  Head: Normocephalic.  Mouth/Throat: Oropharynx is clear and moist. No oropharyngeal exudate.  Eyes: Conjunctivae are normal. Pupils are equal, round, and reactive to light.  Neck: Normal range of motion. Neck supple.  Cardiovascular: Normal heart sounds and intact distal pulses.  An irregularly irregular rhythm present. Tachycardia present.  Exam reveals no friction rub.   No murmur heard. Pulmonary/Chest: Effort normal and breath sounds normal. No respiratory distress. She has no wheezes. She has no rales.  Abdominal: Soft. She exhibits no distension. There is no tenderness. There is no rebound.  Musculoskeletal: She exhibits no edema.  Neurological: She is alert and oriented to person, place, and time.  Skin: Skin is warm. No rash noted.  Nursing note and vitals reviewed.   ED Course  Procedures (including critical care time) Labs Review Labs Reviewed  BASIC METABOLIC PANEL - Abnormal; Notable for the following:    Glucose, Bld 111 (*)    Calcium 8.8 (*)    All other components within normal limits  CBC - Abnormal; Notable for the following:    Hemoglobin 10.6 (*)    HCT 34.8 (*)    MCV 77.9 (*)    MCH 23.7 (*)    RDW 16.2 (*)    All other components within normal limits  BRAIN NATRIURETIC PEPTIDE - Abnormal; Notable for the following:    B Natriuretic Peptide 428.7 (*)    All other components within normal limits  HEPATIC FUNCTION PANEL - Abnormal; Notable for the following:    Albumin 3.2 (*)    AST 55 (*)    Bilirubin, Direct <0.1 (*)    All other components within  normal limits  TROPONIN I  TSH  TROPONIN I  BASIC METABOLIC PANEL  CBC  MAGNESIUM  TROPONIN I  TROPONIN I    Imaging Review Dg Chest 2 View  06/18/2015  CLINICAL DATA:  Cough with shortness breath and chest pain for 1 week. EXAM: CHEST  2 VIEW COMPARISON:  Portable chest 12/10/2014. FINDINGS: The heart size appears stable at the upper limits of normal. The mediastinal contours are normal. The lungs appear unchanged. There is no edema, confluent airspace opacity or pleural effusion. No acute or significant osseous findings are seen. IMPRESSION: Stable chest.  No active cardiopulmonary process. Electronically Signed   By: Carey Bullocks M.D.   On: 06/18/2015 14:32   I have personally reviewed and evaluated these images and lab results as part of my medical decision-making.   EKG Interpretation   Date/Time:  Monday June 18 2015 13:50:35 EDT Ventricular Rate:  146 PR Interval:    QRS Duration: 86 QT Interval:  324 QTC Calculation: 504  R Axis:   37 Text Interpretation:  Atrial fibrillation with rapid ventricular response  Nonspecific ST and T wave abnormality Abnormal ECG Confirmed by Juleen China  MD,  STEPHEN ([id number]) on 06/18/2015 3:12:29 PM      MDM  55 year old female with past medical history of CHF and atrial fibrillation who presents with Symptomatic palpitations and shortness of breath x 1.5 weeks. Not taking meds. On arrival, pt tachycardic, in AFib with RVR on initial EKG. Remainder of exam as above. History, presentation c/w symptomatic AFib with RVR. Likely etiology is non-adherence to meds with underlying primary AFib with CHF. No recent infectious sx or triggers. No new medications. No stimulant uses. She does have some CP with her palpitations but EKG non-ischemic; likely demand, will check trop. Otherwise, will give ASA, her eliquis, and start dilt gtt. EF>35% on last TTE. She has had good response to dilt in past. Will discuss with Cardiology as well.  Discussed case with  Cardiology. Will start dilt gtt. Labs otherwise unremarkable. CBC with mild anemia, likely chronic with low MCV, elevated RDW c/w iron deficiency. Trop negative. BMP without significant electrolyte abnormality. CXR is clear.   HR improving on diltiazem bolus and drip. Symptoms improved. Cardiology will admit.   Clinical Impression: 1. Atrial fibrillation with RVR (HCC)   2. Acute on chronic combined systolic and diastolic congestive heart failure, NYHA class 2 (HCC)   3. Iron deficiency anemia     Disposition: Admit  Condition: Stable  Pt seen in conjunction with Dr. Hilarie Fredrickson, MD 06/19/15 [id number]  Richardean Canal, MD 06/19/15 [phone]

## 2015-06-18 NOTE — ED Notes (Signed)
Report attempted again. Receiving rn busy with another patient.

## 2015-06-18 NOTE — ED Notes (Signed)
MD aware of Pt.s elevated Heart rate,

## 2015-06-18 NOTE — ED Notes (Signed)
Pt with no chest pain at this time.

## 2015-06-18 NOTE — ED Notes (Signed)
Report attempted. Floor not ready for report yet.

## 2015-06-18 NOTE — Progress Notes (Signed)
   06/18/15 2101  Vitals  Temp 98.2 F (36.8 C)  Temp Source Oral  BP 127/78 mmHg  BP Location Right Arm  BP Method Automatic  Patient Position (if appropriate) Lying  Pulse Rate 92  Pulse Rate Source Dinamap  Resp 18  Oxygen Therapy  SpO2 100 %  O2 Device Room Air  Height and Weight  Height 5\' 4"  (1.626 m)  Weight 122.244 kg (269 lb 8 oz)  BSA (Calculated - sq m) 2.35 sq meters  BMI (Calculated) 46.4  Weight in (lb) to have BMI = 25 145.3   Pt arrived to unit. VSS. No complaints of pain. Pt oriented to room.   Sandrea Hammond RN

## 2015-06-18 NOTE — ED Notes (Signed)
Patient here with increased left sided chest pain/palpitations and shortness of breath since last Thursday. Reports hx of chf and atrial fib

## 2015-06-18 NOTE — H&P (Signed)
History & Physical    Patient ID: Wanda Trujillo MRN: [id number], DOB/AGE: 10/17/1960   Admit date: 06/18/2015   Primary Physician: Egbert Garibaldi, NP Primary Cardiologist: Seen by Dr. Purvis Sheffield in 11/2014 - No F/U since  History of Present Illness    Wanda Trujillo is a 55 y.o. female with past medical history of PAF  (diagnosed 11/2014, TEE/DCCV, placed on Eliquis), NICM (EF 40-45% by echo in 11/2014), HTN, and prior tobacco abuse who presents to [facility] ED on 06/18/2015 for evaluation of chest pain and palpitations starting last Thursday.  The patient reports having a cough and sinus congestion for the past several weeks. Starting on Thursday (06/14/2015), she developed shortness of breath, palpitations, and began to feel lightheaded and dizzy. Her symptoms have continued to progress until she developed chest pain today and her family urged her to come to the ED. Reports the pain feeling like something squeezing her chest and resembles the pain she experienced when initially diagnosed with atrial fibrillation in 11/2014.  She quit her Eliquis along with Coreg, Cardizem CD, and Lisinopril back in 02/2015 due to financial restrictions and not having insurance. She has not followed up with Cardiology since her hospitalization in 11/2014.    Initial EKG shows atrial fibrillation with RVR and HR of 146. She was started on IV Cardizem while in the ED with her HR now in the 90's - 110's. CXR shows no acute cardiopulmonary abnormalities. WBC 4.6. Hgb 10.6. Platelets 157. TSH 1.172. BNP 428. Creatinine 0.87. K+ 3.5.  Past Medical History    Past Medical History  Diagnosis Date  . NICM (nonischemic cardiomyopathy) (HCC)     a. Dx in 2010 in [place] - no prior cath; patient reports prior negative stress tests. b. 2D ECHO with EF 40-45%, mild concentric hypertrophy. Diffuse hypokinesis. There is akinesis of the basal inferior myocardium.  . Essential hypertension   . PAF (paroxysmal  atrial fibrillation) (HCC)     a. new dx on 11/2014 admission. s/p TEE/DCCV. placed on Eliquis  . Morbid obesity (HCC)   . Anemia     a. Noted on labs 11/2014, no prior to compare to.  . Chronic systolic CHF (congestive heart failure) (HCC)   . Former tobacco use     Past Surgical History  Procedure Laterality Date  . Laparoscopic gastric sleeve resection N/A     2012  . Tee without cardioversion N/A 12/11/2014    Procedure: TRANSESOPHAGEAL ECHOCARDIOGRAM (TEE);  Surgeon: Chilton Si, MD;  Location: [facility] ENDOSCOPY;  Service: Cardiovascular;  Laterality: N/A;  . Cardioversion N/A 12/11/2014    Procedure: CARDIOVERSION;  Surgeon: Chilton Si, MD;  Location: [facility] ENDOSCOPY;  Service: Cardiovascular;  Laterality: N/A;     Allergies  No Known Allergies   Home Medications    Prior to Admission medications   Medication Sig Start Date End Date Taking? Authorizing Provider  apixaban (ELIQUIS) 5 MG TABS tablet Take 1 tablet (5 mg total) by mouth 2 (two) times daily. Patient not taking: Reported on 06/18/2015 12/11/14   Janetta Hora, PA-C  carvedilol (COREG) 3.125 MG tablet Take 1 tablet (3.125 mg total) by mouth 2 (two) times daily with a meal. Patient not taking: Reported on 06/18/2015 12/11/14   Janetta Hora, PA-C  diltiazem (CARDIZEM CD) 180 MG 24 hr capsule Take 1 capsule (180 mg total) by mouth daily. Patient not taking: Reported on 06/18/2015 12/11/14   Janetta Hora, PA-C  lisinopril (PRINIVIL,ZESTRIL) 5 MG tablet Take  1 tablet (5 mg total) by mouth daily. Patient not taking: Reported on 06/18/2015 12/11/14   Janetta Hora, PA-C    Family History    Family History  Problem Relation Age of Onset  . Hypertension Mother     Social History    Social History   Social History  . Marital Status: Married    Spouse Name: N/A  . Number of Children: N/A  . Years of Education: N/A   Occupational History  . Not on file.   Social History Main Topics  .  Smoking status: Never Smoker   . Smokeless tobacco: Not on file  . Alcohol Use: Yes     Comment: occ  . Drug Use: No  . Sexual Activity: Not on file   Other Topics Concern  . Not on file   Social History Narrative     Review of Systems    General:  No chills, fever, night sweats or weight changes.  Cardiovascular:  No dyspnea on exertion, edema, orthopnea, paroxysmal nocturnal dyspnea. Positive for chest pain, palpitations, and dyspnea. Dermatological: No rash, lesions/masses Respiratory: Positive for dyspnea and cough. Urologic: No hematuria, dysuria Abdominal:   No nausea, vomiting, diarrhea, bright red blood per rectum, melena, or hematemesis Neurologic:  No visual changes, wkns, changes in mental status. All other systems reviewed and are otherwise negative except as noted above.  Physical Exam    Blood pressure 126/52, pulse 106, temperature 98.9 F (37.2 C), temperature source Oral, resp. rate 26, height 5\' 4"  (1.626 m), weight 265 lb (120.203 kg), last menstrual period 05/25/2015, SpO2 100 %.  General: Well developed, well nourished,female appearing in no acute distress. Head: Normocephalic, atraumatic, sclera non-icteric, no xanthomas, nares are without discharge.  Neck: No carotid bruits. JVD not elevated.  Lungs: Respirations regular and unlabored, without wheezes or rales.  Heart: Irregularly irregular. No S3 or S4.  No murmur, no rubs, or gallops appreciated. Abdomen: Soft, non-tender, non-distended with normoactive bowel sounds. No hepatomegaly. No rebound/guarding. No obvious abdominal masses. Msk:  Strength and tone appear normal for age. No joint deformities or effusions. Extremities: No clubbing or cyanosis. No edema.  Distal pedal pulses are 2+ bilaterally. Neuro: Alert and oriented X 3. Moves all extremities spontaneously. No focal deficits noted. Psych:  Responds to questions appropriately with a normal affect. Skin: No rashes or lesions noted  Labs      Troponin (Point of Care Test) No results for input(s): TROPIPOC in the last 72 hours.  Recent Labs  06/18/15 1444  TROPONINI <0.03   Lab Results  Component Value Date   WBC 4.6 06/18/2015   HGB 10.6* 06/18/2015   HCT 34.8* 06/18/2015   MCV 77.9* 06/18/2015   PLT 157 06/18/2015     Recent Labs Lab 06/18/15 1444  NA 142  K 3.5  CL 108  CO2 24  BUN 12  CREATININE 0.87  CALCIUM 8.8*  PROT 6.9  BILITOT 0.7  ALKPHOS 68  ALT 30  AST 55*  GLUCOSE 111*   Lab Results  Component Value Date   CHOL 159 12/11/2014   HDL 57 12/11/2014   LDLCALC 81 12/11/2014   TRIG 103 12/11/2014   No results found for: DDIMER   B NATRIURETIC PEPTIDE  Date/Time Value Ref Range Status  06/18/2015 02:44 PM 428.7* 0.0 - 100.0 pg/mL Final  12/10/2014 07:29 AM 492.6* 0.0 - 100.0 pg/mL Final    Radiology Studies    Dg Chest 2 View: 06/18/2015  CLINICAL DATA:  Cough with shortness breath and chest pain for 1 week. EXAM: CHEST  2 VIEW COMPARISON:  Portable chest 12/10/2014. FINDINGS: The heart size appears stable at the upper limits of normal. The mediastinal contours are normal. The lungs appear unchanged. There is no edema, confluent airspace opacity or pleural effusion. No acute or significant osseous findings are seen. IMPRESSION: Stable chest.  No active cardiopulmonary process. Electronically Signed   By: Carey Bullocks M.D.   On: 06/18/2015 14:32    EKG & Cardiac Imaging    EKG: Atrial fibrillation with RVR and HR of 146  ECHOCARDIOGRAM: 12/11/2014 Study Conclusions - Left ventricle: The cavity size was normal. There was mild  concentric hypertrophy. Systolic function was mildly to  moderately reduced. The estimated ejection fraction was in the  range of 40% to 45%. Diffuse hypokinesis. There is akinesis of  the basalinferior myocardium. - Aortic valve: Trileaflet; normal thickness, mildly calcified  leaflets. - Mitral valve: There was trivial regurgitation.  Assessment &  Plan    1. Atrial Fibrillation with RVR - initially diagnosed with PAF in 11/2014 and underwent TEE/DCCV and was placed on Eliquis. - developed palpitations, dizziness, and lightheadedness on 06/14/2015, which was likely her onset. Unknown trigger, possibly viral URI with increased coughing and sinus congestion. - this patients CHA2DS2-VASc Score and unadjusted Ischemic Stroke Rate (% per year) is equal to 3.2 % stroke rate/year from a score of 3 (CHF, HTN, Female). Self-discontinued Eliquis as an outpatient secondary to cost. Restarted today. Would require at minimum 3 doses before TEE/DCCV. - TSH 1.172,  K+ 3.5. Will check Mg. Will cycle troponin values. - started on IV Cardizem while in the ED. Would switch to BB prior to discharge with known reduced EF. - will likely require TEE/DCCV if she does not convert with medications. The importance of medication compliance and increased risk of stroke with atrial fibrillation was thoroughly reviewed with the patient and will need to be continually reinforced.  2. NICM - echo from 11/2014 showed an EF of 40-45% with diffuse hypokinesis and akinesis of the basal inferior myocardium. - patient reports having multiple negative NST's in the past but no cardiac catheterization. - further outpatient ischemic evaluation was recommended last admission, but she never followed up. - restarted on ACE-I. Would convert from Cardizem to BB once HR better controlled with known reduced EF.   3. HTN - BP has been 106/52 - 143/112 while in the ED. - currently on Cardizem drip. Restarted on ACE-I.  - continue to monitor.  4. Medical Noncompliance - patient reports stopping all of her medications in 02/2015 due to cost and not having insurance. Never followed up after her TEE/DCCV in 11/2014.  - appears she has insurance coverage at this time.  - appreciate Case Management's assistance.   Signed, Ellsworth Lennox, PA-C 06/18/2015, 6:59 PM Pager:  [phone]

## 2015-06-19 ENCOUNTER — Encounter (HOSPITAL_COMMUNITY): Payer: Self-pay | Admitting: General Practice

## 2015-06-19 LAB — CBC
HEMATOCRIT: 32.4 % — AB (ref 36.0–46.0)
Hemoglobin: 9.9 g/dL — ABNORMAL LOW (ref 12.0–15.0)
MCH: 23.5 pg — ABNORMAL LOW (ref 26.0–34.0)
MCHC: 30.6 g/dL (ref 30.0–36.0)
MCV: 77 fL — AB (ref 78.0–100.0)
Platelets: 154 10*3/uL (ref 150–400)
RBC: 4.21 MIL/uL (ref 3.87–5.11)
RDW: 16.4 % — AB (ref 11.5–15.5)
WBC: 4.1 10*3/uL (ref 4.0–10.5)

## 2015-06-19 LAB — BASIC METABOLIC PANEL
Anion gap: 10 (ref 5–15)
BUN: 10 mg/dL (ref 6–20)
CHLORIDE: 105 mmol/L (ref 101–111)
CO2: 25 mmol/L (ref 22–32)
Calcium: 8.5 mg/dL — ABNORMAL LOW (ref 8.9–10.3)
Creatinine, Ser: 0.76 mg/dL (ref 0.44–1.00)
GFR calc Af Amer: >60 mL/min (ref >60)
GFR calc non Af Amer: >60 mL/min (ref >60)
Glucose, Bld: 117 mg/dL — ABNORMAL HIGH (ref 65–99)
POTASSIUM: 3.4 mmol/L — AB (ref 3.5–5.1)
SODIUM: 140 mmol/L (ref 135–145)

## 2015-06-19 LAB — TROPONIN I: Troponin I: <0.03 ng/mL (ref <0.031)

## 2015-06-19 LAB — MAGNESIUM: Magnesium: 1.7 mg/dL (ref 1.7–2.4)

## 2015-06-19 NOTE — Progress Notes (Signed)
Patient Name: Wanda Trujillo Date of Encounter: 06/19/2015  Hospital Problem List     Principal Problem:   Atrial fibrillation with RVR (HCC) Active Problems:   PAF (paroxysmal atrial fibrillation) (HCC)   NICM (nonischemic cardiomyopathy) (HCC)   Chronic combined systolic and diastolic CHF (congestive heart failure) (HCC)   Morbid obesity (HCC)   Essential hypertension   Acute on chronic combined systolic and diastolic congestive heart failure, NYHA class 2 (HCC)    Subjective   Breathing improved with rate control.  NO PND/Orthopnea. No more CP. Rate still goes up with activity.  Inpatient Medications    . apixaban  5 mg Oral BID  . lisinopril  5 mg Oral Daily    Vital Signs    Filed Vitals:   06/18/15 1945 06/18/15 2000 06/18/15 2101 06/19/15 0437  BP: 105/92 125/68 127/78 116/78  Pulse: 108 95 92 88  Temp:   98.2 F (36.8 C) 98 F (36.7 C)  TempSrc:   Oral Oral  Resp: 27 21 18 18   Height:   5\' 4"  (1.626 m)   Weight:   269 lb 8 oz (122.244 kg)   SpO2: 99% 99% 100% 97%   No intake or output data in the 24 hours ending 06/19/15 0806 Filed Weights   06/18/15 1353 06/18/15 2101  Weight: 265 lb (120.203 kg) 269 lb 8 oz (122.244 kg)    Physical Exam    General: Pleasant, NAD.  Neuro: Alert and oriented X 3. Moves all extremities spontaneously. Psych: Normal mood & affect. HEENT: [place]/AT, EOMI, MMM, anicteric sclera  Neck: Supple without bruits or JVD. Lungs:  Resp regular and unlabored, CTAB. Heart: Irreg-Irreg; distant, but normal S1 & S2.  No M/R/G  - unable to palpate PMI due to body habitus. Abdomen: Soft, non-tender, non-distended, BS + x 4.  Extremities: No clubbing, cyanosis or edema. DP/PT/Radials 2+ and equal bilaterally.  Labs    CBC  Recent Labs  06/18/15 1444 06/19/15 0221  WBC 4.6 4.1  HGB 10.6* 9.9*  HCT 34.8* 32.4*  MCV 77.9* 77.0*  PLT 157 154   Basic Metabolic Panel  Recent Labs  06/18/15 1444 06/19/15 0221  NA 142 140   K 3.5 3.4*  CL 108 105  CO2 24 25  GLUCOSE 111* 117*  BUN 12 10  CREATININE 0.87 0.76  CALCIUM 8.8* 8.5*  MG  --  1.7   Liver Function Tests  Recent Labs  06/18/15 1444  AST 55*  ALT 30  ALKPHOS 68  BILITOT 0.7  PROT 6.9  ALBUMIN 3.2*   No results for input(s): LIPASE, AMYLASE in the last 72 hours. Cardiac Enzymes  Recent Labs  06/18/15 1444 06/18/15 2058 06/19/15 0221  TROPONINI <0.03 <0.03 <0.03   BNP Invalid input(s): POCBNP D-Dimer No results for input(s): DDIMER in the last 72 hours. Hemoglobin A1C No results for input(s): HGBA1C in the last 72 hours. Fasting Lipid Panel No results for input(s): CHOL, HDL, LDLCALC, TRIG, CHOLHDL, LDLDIRECT in the last 72 hours. Thyroid Function Tests  Recent Labs  06/18/15 1444  TSH 1.172    Telemetry    Afib ranging from 80s-120 bpm  ECG    pending  Radiology    Dg Chest 2 View  06/18/2015  CLINICAL DATA:  Cough with shortness breath and chest pain for 1 week. EXAM: CHEST  2 VIEW COMPARISON:  Portable chest 12/10/2014. FINDINGS: The heart size appears stable at the upper limits of normal. The mediastinal contours are normal.  The lungs appear unchanged. There is no edema, confluent airspace opacity or pleural effusion. No acute or significant osseous findings are seen. IMPRESSION: Stable chest.  No active cardiopulmonary process. Electronically Signed   By: Carey Bullocks M.D.   On: 06/18/2015 14:32    Assessment & Plan    Principal Problem:   Atrial fibrillation with RVR (HCC) - rate improved on CCB, but remains in Afib with inadequate rate control  Continue Dilt gtt for now  Continue Eliquis  If she remains in Afib tomorrow - plan TEE/DCCV  Active Problems:   PAF (paroxysmal atrial fibrillation) (HCC) - she stopped taking her home CCB & Eliquis  Will hope to convert to long acting BB once NSR restored.  Has reduced EF & is symptomatic when in Afib. - Will have her f/u in Afib Clinic to determine  +/- benefit of possible antiarrythmic agent.    NICM (nonischemic cardiomyopathy) (HCC) / Chronic combined systolic and diastolic CHF (congestive heart failure) (HCC) - prior Myoview, negative  Reassess EF once NSR restored as her EF is likely lower than baseline with Afib.  Acute on chronic combined systolic and diastolic congestive heart failure, NYHA class 2 (HCC)  Sx improved with rate control; lying flat --> seems euvolemic  May need PRN lasix, but ok for now  Prefer use of BB over CCB for rate control on d/c  Is on ACE-I       Morbid obesity (HCC) - will need nutrition counseling.   Essential hypertension -- on ACE-I. BP controlled on IV Diltiazem (convert to equivalent dose of PO Toprol prior to d/c)  Signed, Herbie Baltimore, Piedad Climes, M.D., M.S. Interventional Cardiologist   Pager # [phone] Phone # [phone] [address] [place], [place] [address]

## 2015-06-19 NOTE — Care Management Note (Signed)
Case Management Note  Patient Details  Name: Wanda Trujillo MRN: [id number] Date of Birth: 1961-01-22  Subjective/Objective:                  Principal Problem:  Atrial fibrillation with RVR (HCC)  Action/Plan: CM spoke to the patient at the bedside about her discharge plan. Patient without insurance so CM provided eliquis 30 Day free card and explained its use. CM also gave pamphlet for [facility] along with appointment on Jun 26, 2015 at 3:30 pm. Cm explained that patient can get medications filled at the [facility] and patient verbalized understanding. Patient said that her brother will take her home and denies any other support needed at home and no DME needs. CM remains available should additional needs arise.   Expected Discharge Date:  06/20/15               Expected Discharge Plan:  Home/Self Care  In-House Referral:     Discharge planning Services  CM Consult, [facility], Medication Assistance, Follow-up appt scheduled  Post Acute Care Choice:    Choice offered to:  Patient  DME Arranged:    DME Agency:     HH Arranged:    HH Agency:     Status of Service:  In process, will continue to follow  Medicare Important Message Given:    Date Medicare IM Given:    Medicare IM give by:    Date Additional Medicare IM Given:    Additional Medicare Important Message give by:     If discussed at Long Length of Stay Meetings, dates discussed:    Additional Comments:  Darcel Smalling, RN 06/19/2015, 9:04 PM

## 2015-06-20 MED ORDER — METOPROLOL TARTRATE 50 MG PO TABS
50.0000 mg | ORAL_TABLET | Freq: Two times a day (BID) | ORAL | Status: DC
  Administered 2015-06-20 – 2015-06-21 (×3): 50 mg via ORAL
  Filled 2015-06-20 (×3): qty 1

## 2015-06-20 MED ORDER — DILTIAZEM HCL ER COATED BEADS 240 MG PO CP24: 240.0000 mg | ORAL_CAPSULE | Freq: Every day | ORAL | Status: DC

## 2015-06-20 MED ORDER — SODIUM CHLORIDE 0.9% FLUSH
3.0000 mL | Freq: Two times a day (BID) | INTRAVENOUS | Status: DC
  Administered 2015-06-20 – 2015-06-21 (×2): 3 mL via INTRAVENOUS

## 2015-06-20 MED ORDER — DILTIAZEM HCL ER COATED BEADS 240 MG PO CP24
240.0000 mg | ORAL_CAPSULE | Freq: Once | ORAL | Status: AC
  Administered 2015-06-20: 240 mg via ORAL
  Filled 2015-06-20: qty 1

## 2015-06-20 MED ORDER — HYDROCORTISONE 1 % EX CREA
1.0000 "application " | TOPICAL_CREAM | Freq: Three times a day (TID) | CUTANEOUS | Status: DC | PRN
  Filled 2015-06-20: qty 28

## 2015-06-20 MED ORDER — SODIUM CHLORIDE 0.9 % IV SOLN: 250.0000 mL | INTRAVENOUS | Status: DC

## 2015-06-20 MED ORDER — SODIUM CHLORIDE 0.9% FLUSH: 3.0000 mL | INTRAVENOUS | Status: DC | PRN

## 2015-06-20 NOTE — Progress Notes (Signed)
06/20/2015 12:30 PM  Pt has become more SOB this afternoon and HR is still increasing when up.  Notified MD.  Pt will stay in hospital and have cardioversion as planned tomorrow. Kathryne Hitch

## 2015-06-20 NOTE — Progress Notes (Addendum)
Patient Name: Wanda Trujillo Date of Encounter: 06/20/2015  Hospital Problem List     Principal Problem:   Atrial fibrillation with RVR (HCC) Active Problems:   PAF (paroxysmal atrial fibrillation) (HCC)   NICM (nonischemic cardiomyopathy) (HCC)   Chronic combined systolic and diastolic CHF (congestive heart failure) (HCC)   Morbid obesity (HCC)   Essential hypertension   Acute on chronic combined systolic and diastolic congestive heart failure, NYHA class 2 (HCC)    Subjective   Breathing improved with rate control.  NO PND/Orthopnea. No more CP. - Feels much Better. Rate still goes up with activity - but less symptomatic  Inpatient Medications    . apixaban  5 mg Oral BID  . diltiazem  240 mg Oral Daily  . lisinopril  5 mg Oral Daily  . metoprolol tartrate  50 mg Oral BID    Vital Signs    Filed Vitals:   06/19/15 1900 06/19/15 2125 06/20/15 0846 06/20/15 0847  BP: 141/105 120/67 135/80   Pulse:  105  79  Temp:  98.7 F (37.1 C)    TempSrc:  Oral    Resp:  18    Height:      Weight:      SpO2:  100%      Intake/Output Summary (Last 24 hours) at 06/20/15 1102 Last data filed at 06/19/15 2100  Gross per 24 hour  Intake    960 ml  Output      0 ml  Net    960 ml   Filed Weights   06/18/15 1353 06/18/15 2101  Weight: 265 lb (120.203 kg) 269 lb 8 oz (122.244 kg)    Physical Exam    General: Pleasant, NAD.  Neuro: Alert and oriented X 3. Moves all extremities spontaneously. Psych: Normal mood & affect. HEENT: [place]/AT, EOMI, MMM, anicteric sclera  Neck: Supple without bruits or JVD. Lungs:  Resp regular and unlabored, CTAB. Heart: Irreg-Irreg; distant, but normal S1 & S2.  No M/R/G  - unable to palpate PMI due to body habitus. Abdomen: Soft, non-tender, non-distended, BS + x 4.  Extremities: No clubbing, cyanosis or edema. DP/PT/Radials 2+ and equal bilaterally.  Labs    CBC  Recent Labs  06/18/15 1444 06/19/15 0221  WBC 4.6 4.1  HGB 10.6*  9.9*  HCT 34.8* 32.4*  MCV 77.9* 77.0*  PLT 157 154   Basic Metabolic Panel  Recent Labs  06/18/15 1444 06/19/15 0221  NA 142 140  K 3.5 3.4*  CL 108 105  CO2 24 25  GLUCOSE 111* 117*  BUN 12 10  CREATININE 0.87 0.76  CALCIUM 8.8* 8.5*  MG  --  1.7   Liver Function Tests  Recent Labs  06/18/15 1444  AST 55*  ALT 30  ALKPHOS 68  BILITOT 0.7  PROT 6.9  ALBUMIN 3.2*   No results for input(s): LIPASE, AMYLASE in the last 72 hours. Cardiac Enzymes  Recent Labs  06/18/15 2058 06/19/15 0221 06/19/15 0818  TROPONINI <0.03 <0.03 <0.03   BNP Invalid input(s): POCBNP D-Dimer No results for input(s): DDIMER in the last 72 hours. Hemoglobin A1C No results for input(s): HGBA1C in the last 72 hours. Fasting Lipid Panel No results for input(s): CHOL, HDL, LDLCALC, TRIG, CHOLHDL, LDLDIRECT in the last 72 hours. Thyroid Function Tests  Recent Labs  06/18/15 1444  TSH 1.172    Telemetry    Afib ranging from 80s-120 bpm  ECG    pending  Radiology  Dg Chest 2 View  06/18/2015  CLINICAL DATA:  Cough with shortness breath and chest pain for 1 week. EXAM: CHEST  2 VIEW COMPARISON:  Portable chest 12/10/2014. FINDINGS: The heart size appears stable at the upper limits of normal. The mediastinal contours are normal. The lungs appear unchanged. There is no edema, confluent airspace opacity or pleural effusion. No acute or significant osseous findings are seen. IMPRESSION: Stable chest.  No active cardiopulmonary process. Electronically Signed   By: Carey Bullocks M.D.   On: 06/18/2015 14:32    Assessment & Plan    Principal Problem:   Atrial fibrillation with RVR (HCC) - rate improved on CCB, but remains in Afib =- rate is somewhat better controlled now.   I will convert from IV to oral diltiazem - diltiazem CD 240 mg x 1 today, had of also written for 50 mg by mouth twice a day metoprolol. -> She may not need both following her cardioversion, however until she  is cardioverted - will cover with one additional dose of diltiazem. Not on discharge  Continue Eliquis  Unable to get her on the schedule for TEE cardioversion today. Plan would be to be discharge her today with rate control on beta blocker and calcium channel blocker with scheduled outpatient TEE/DCCV for tomorrow.  Active Problems:   PAF (paroxysmal atrial fibrillation) (HCC) - she stopped taking her home CCB & Eliquis  Will hope to convert to long acting BB once NSR restored following cardioversion tomorrow. Additional diltiazem was given simply to ensure adequate rate control until cardioversion.  Has reduced EF & is symptomatic when in Afib. - Will have her f/u in Afib Clinic to determine +/- benefit of possible antiarrythmic agent.    NICM (nonischemic cardiomyopathy) (HCC) / Chronic combined systolic and diastolic CHF (congestive heart failure) (HCC) - prior Myoview, negative  Reassess EF once NSR restored as her EF is likely lower than baseline with Afib.  Acute on chronic combined systolic and diastolic congestive heart failure, NYHA class 2 (HCC)  Sx improved with rate control; lying flat --> seems euvolemic  May need PRN lasix, but ok for now; will defer to outpatient cardiologist  Prefer use of BB over CCB for rate control  -- we will not prescribe diltiazem on discharge.  Is on ACE-I       Morbid obesity (HCC) - will need nutrition counseling.   Essential hypertension -- on ACE-I. BP controlled on IV Diltiazem (convert to equivalent dose of PO Toprol prior to d/c)  Signed, Herbie Baltimore, Piedad Climes, M.D., M.S. Interventional Cardiologist   Pager # [phone] Phone # [phone] [address] [place], [place] [address]  ADDENDUM  Patient is voicing concerns about being somewhat dyspneic with walking and her heart rate going up. Also will have difficulty ensuring that she is able to have a ride available to come in tomorrow for cardioversion.  At this point I  think is probably safest with her history of nonadherence to keep her tonight for planned cardioversion tomorrow. I will try to continue with the oral rate control agents but continue to monitor her rate control.  Marykay Lex, MD

## 2015-06-20 NOTE — Care Management Note (Addendum)
Case Management Note Previous CM note initiated by Darcel Smalling, RN CM  Patient Details  Name: Wanda Trujillo MRN: [id number] Date of Birth: March 02, 1960  Subjective/Objective:                  Principal Problem:  Atrial fibrillation with RVR (HCC)  Action/Plan: CM spoke to the patient at the bedside about her discharge plan. Patient without insurance so CM provided eliquis 30 Day free card and explained its use. CM also gave pamphlet for [facility] along with appointment on Jun 26, 2015 at 3:30 pm. Cm explained that patient can get medications filled at the [facility] and patient verbalized understanding. Patient said that her brother will take her home and denies any other support needed at home and no DME needs. CM remains available should additional needs arise.   Expected Discharge Date:  06/20/15               Expected Discharge Plan:  Home/Self Care  In-House Referral:     Discharge planning Services  CM Consult, [facility], Medication Assistance, Follow-up appt scheduled  Post Acute Care Choice:    Choice offered to:  Patient  DME Arranged:    DME Agency:     HH Arranged:    HH Agency:     Status of Service:  In process, will continue to follow  Medicare Important Message Given:    Date Medicare IM Given:    Medicare IM give by:    Date Additional Medicare IM Given:    Additional Medicare Important Message give by:     If discussed at Long Length of Stay Meetings, dates discussed:    Additional Comments:  06/20/15- 1100- Donn Pierini RN, BSN - f/u done with pt regarding d/c needs- pt has [facility] info- and appointment time- she also has Eliquis card and states she has never used the 30 day free card so should be able to use it-  This CM also gave pt the pt assistance form for Eliquis so that she can fill out and take with her to her [facility] appointment and have them assist her with applying for further assistance for Eliquis-  CM to continue to follow for any further  medication needs or possible MATCH need.   Darrold Span, RN 06/20/2015, 11:59 AM

## 2015-06-21 ENCOUNTER — Inpatient Hospital Stay (HOSPITAL_COMMUNITY): Payer: MEDICAID | Admitting: Anesthesiology

## 2015-06-21 ENCOUNTER — Inpatient Hospital Stay (HOSPITAL_COMMUNITY): Payer: MEDICAID

## 2015-06-21 ENCOUNTER — Encounter (HOSPITAL_COMMUNITY)
Admission: EM | Disposition: A | Discharge status: Home / Self Care | Payer: Self-pay | Source: Home / Self Care | Attending: Cardiology

## 2015-06-21 ENCOUNTER — Encounter (HOSPITAL_COMMUNITY): Payer: Self-pay | Admitting: *Deleted

## 2015-06-21 DIAGNOSIS — I340 Nonrheumatic mitral (valve) insufficiency: Secondary | ICD-10-CM

## 2015-06-21 DIAGNOSIS — I4891 Unspecified atrial fibrillation: Secondary | ICD-10-CM

## 2015-06-21 HISTORY — PX: CARDIOVERSION: SHX1299

## 2015-06-21 HISTORY — PX: TEE WITHOUT CARDIOVERSION: SHX5443

## 2015-06-21 LAB — PROTIME-INR
INR: 1.25 (ref 0.00–1.49)
Prothrombin Time: 15.9 seconds — ABNORMAL HIGH (ref 11.6–15.2)

## 2015-06-21 LAB — BASIC METABOLIC PANEL
Anion gap: 9 (ref 5–15)
BUN: 9 mg/dL (ref 6–20)
CALCIUM: 8.7 mg/dL — AB (ref 8.9–10.3)
CHLORIDE: 106 mmol/L (ref 101–111)
CO2: 26 mmol/L (ref 22–32)
CREATININE: 0.61 mg/dL (ref 0.44–1.00)
GFR calc Af Amer: >60 mL/min (ref >60)
GFR calc non Af Amer: >60 mL/min (ref >60)
Glucose, Bld: 108 mg/dL — ABNORMAL HIGH (ref 65–99)
Potassium: 3.8 mmol/L (ref 3.5–5.1)
Sodium: 141 mmol/L (ref 135–145)

## 2015-06-21 LAB — MAGNESIUM: Magnesium: 1.7 mg/dL (ref 1.7–2.4)

## 2015-06-21 SURGERY — ECHOCARDIOGRAM, TRANSESOPHAGEAL
Anesthesia: Monitor Anesthesia Care

## 2015-06-21 MED ORDER — PROPOFOL 500 MG/50ML IV EMUL
INTRAVENOUS | Status: DC | PRN
  Administered 2015-06-21: 50 ug/kg/min via INTRAVENOUS

## 2015-06-21 MED ORDER — METOPROLOL TARTRATE 5 MG/5ML IV SOLN
5.0000 mg | Freq: Once | INTRAVENOUS | Status: AC
  Administered 2015-06-21: 5 mg via INTRAVENOUS
  Filled 2015-06-21: qty 5

## 2015-06-21 MED ORDER — METOPROLOL TARTRATE 50 MG PO TABS: 75.0000 mg | ORAL_TABLET | Freq: Two times a day (BID) | ORAL | Status: DC

## 2015-06-21 MED ORDER — METOPROLOL TARTRATE 25 MG PO TABS: 75.0000 mg | ORAL_TABLET | Freq: Two times a day (BID) | ORAL | Status: DC

## 2015-06-21 MED ORDER — SODIUM CHLORIDE 0.9 % IV SOLN: INTRAVENOUS | Status: DC

## 2015-06-21 MED ORDER — LISINOPRIL 5 MG PO TABS: 5.0000 mg | ORAL_TABLET | Freq: Every day | ORAL | Status: DC

## 2015-06-21 MED ORDER — SODIUM CHLORIDE 0.9 % IV SOLN
INTRAVENOUS | Status: DC | PRN
  Administered 2015-06-21: 13:00:00 via INTRAVENOUS

## 2015-06-21 MED ORDER — APIXABAN 5 MG PO TABS: 5.0000 mg | ORAL_TABLET | Freq: Two times a day (BID) | ORAL | Status: DC

## 2015-06-21 MED ORDER — LIDOCAINE HCL (CARDIAC) 20 MG/ML IV SOLN
INTRAVENOUS | Status: DC | PRN
  Administered 2015-06-21: 50 mg via INTRAVENOUS

## 2015-06-21 MED ORDER — METOPROLOL TARTRATE 50 MG PO TABS
50.0000 mg | ORAL_TABLET | Freq: Two times a day (BID) | ORAL | Status: DC
  Filled 2015-06-21: qty 1

## 2015-06-21 MED ORDER — LISINOPRIL 5 MG PO TABS
5.0000 mg | ORAL_TABLET | Freq: Every day | ORAL | Status: DC
Start: 2015-06-21 — End: 2015-06-21

## 2015-06-21 MED ORDER — BUTAMBEN-TETRACAINE-BENZOCAINE 2-2-14 % EX AERO
INHALATION_SPRAY | CUTANEOUS | Status: DC | PRN
  Administered 2015-06-21: 2 via TOPICAL

## 2015-06-21 MED ORDER — MIDAZOLAM HCL 2 MG/2ML IJ SOLN
INTRAMUSCULAR | Status: DC | PRN
  Administered 2015-06-21: 2 mg via INTRAVENOUS

## 2015-06-21 MED ORDER — PROPOFOL 10 MG/ML IV BOLUS
INTRAVENOUS | Status: DC | PRN
  Administered 2015-06-21: 40 mg via INTRAVENOUS

## 2015-06-21 NOTE — Anesthesia Preprocedure Evaluation (Addendum)
Anesthesia Evaluation  Patient identified by MRN, date of birth, ID band Patient awake    Reviewed: Allergy & Precautions, NPO status , Patient's Chart, lab work & pertinent test results, reviewed documented beta blocker date and time   Airway Mallampati: II  TM Distance: >3 FB Neck ROM: Full    Dental  (+) Upper Dentures, Lower Dentures   Pulmonary former smoker,    Pulmonary exam normal        Cardiovascular hypertension, Pt. on medications and Pt. on home beta blockers +CHF  + dysrhythmias Atrial Fibrillation  Rhythm:Irregular Rate:Abnormal     Neuro/Psych negative neurological ROS     GI/Hepatic negative GI ROS, Neg liver ROS,   Endo/Other  Morbid obesity  Renal/GU negative Renal ROS     Musculoskeletal   Abdominal   Peds  Hematology  (+) anemia ,   Anesthesia Other Findings   Reproductive/Obstetrics                            Lab Results  Component Value Date   WBC 4.1 06/19/2015   HGB 9.9* 06/19/2015   HCT 32.4* 06/19/2015   MCV 77.0* 06/19/2015   PLT 154 06/19/2015   Lab Results  Component Value Date   CREATININE 0.61 06/21/2015   BUN 9 06/21/2015   NA 141 06/21/2015   K 3.8 06/21/2015   CL 106 06/21/2015   CO2 26 06/21/2015    Anesthesia Physical  Anesthesia Plan  ASA: III  Anesthesia Plan: MAC   Post-op Pain Management:    Induction: Intravenous  Airway Management Planned: Mask  Additional Equipment:   Intra-op Plan:   Post-operative Plan:   Informed Consent: I have reviewed the patients History and Physical, chart, labs and discussed the procedure including the risks, benefits and alternatives for the proposed anesthesia with the patient or authorized representative who has indicated his/her understanding and acceptance.     Plan Discussed with: CRNA, Anesthesiologist and Surgeon  Anesthesia Plan Comments:        Anesthesia Quick  Evaluation

## 2015-06-21 NOTE — CV Procedure (Signed)
    Electrical Cardioversion Procedure Note, TEE Perrin Petrowski [id number] 04/04/1960  Procedure: Electrical Cardioversion, TEE Indications:  Atrial Fibrillation  Time Out: Verified patient identification, verified procedure,medications/allergies/relevent history reviewed, required imaging and test results available.  Performed  Procedure Details  The patient was NPO after midnight. Anesthesia was administered at the beside  by Dr. Sampson Goon with propofol.  Cardioversion was performed with synchronized biphasic defibrillation via AP pads with 150 joules.  1 attempt(s) were performed.  The patient converted to normal sinus rhythm. The patient tolerated the procedure well   IMPRESSION:  Successful cardioversion of atrial fibrillation EF 30% (tachycardic at the time 150-160) No LA thrombus (spontaneous contrast noted)    Devin Ganaway 06/21/2015, 1:10 PM

## 2015-06-21 NOTE — Transfer of Care (Signed)
Immediate Anesthesia Transfer of Care Note  Patient: Wanda Trujillo  Procedure(s) Performed: Procedure(s): TRANSESOPHAGEAL ECHOCARDIOGRAM (TEE) (N/A) CARDIOVERSION (N/A)  Patient Location: PACU and Endoscopy Unit  Anesthesia Type:MAC  Level of Consciousness: awake, oriented, sedated, patient cooperative and responds to stimulation  Airway & Oxygen Therapy: Patient Spontanous Breathing  Post-op Assessment: Report given to RN, Post -op Vital signs reviewed and stable, Patient moving all extremities and Patient moving all extremities X 4  Post vital signs: Reviewed and stable  Last Vitals:  Filed Vitals:   06/21/15 1213 06/21/15 1323  BP: 159/94 145/90  Pulse: 134 78  Temp: 36.9 C 36.4 C  Resp: 12 23    Last Pain:  Filed Vitals:   06/21/15 1325  PainSc: 0-No pain         Complications: No apparent anesthesia complications

## 2015-06-21 NOTE — Discharge Instructions (Signed)

## 2015-06-21 NOTE — Progress Notes (Signed)
  Echocardiogram Echocardiogram Transesophageal has been performed.  Sheralyn Boatman R 06/21/2015, 1:20 PM

## 2015-06-21 NOTE — Interval H&P Note (Signed)
History and Physical Interval Note:  06/21/2015 12:42 PM  Wanda Trujillo  has presented today for surgery, with the diagnosis of A FIB  The various methods of treatment have been discussed with the patient and family. After consideration of risks, benefits and other options for treatment, the patient has consented to  Procedure(s): TRANSESOPHAGEAL ECHOCARDIOGRAM (TEE) (N/A) CARDIOVERSION (N/A) as a surgical intervention .  The patient's history has been reviewed, patient examined, no change in status, stable for surgery.  I have reviewed the patient's chart and labs.  Questions were answered to the patient's satisfaction.     Remington Highbaugh

## 2015-06-21 NOTE — Progress Notes (Addendum)
Patient discharged home with family. IV was dc'd and was intact. The patient has remained in sinus rhythm since her cardioversion. She ambulated without any problems or increase in heart rate.  Medications were called into [facility] when they should have been at the [facility]. The night PA was informed and she corrected.

## 2015-06-21 NOTE — Anesthesia Postprocedure Evaluation (Signed)
Anesthesia Post Note  Patient: Wanda Trujillo  Procedure(s) Performed: Procedure(s) (LRB): TRANSESOPHAGEAL ECHOCARDIOGRAM (TEE) (N/A) CARDIOVERSION (N/A)  Patient location during evaluation: PACU Anesthesia Type: MAC Level of consciousness: awake and alert Pain management: pain level controlled Vital Signs Assessment: post-procedure vital signs reviewed and stable Respiratory status: spontaneous breathing, nonlabored ventilation, respiratory function stable and patient connected to nasal cannula oxygen Cardiovascular status: stable and blood pressure returned to baseline Anesthetic complications: no    Last Vitals:  Filed Vitals:   06/21/15 1340 06/21/15 1356  BP: 167/103 104/64  Pulse: 78 79  Temp:    Resp: 18     Last Pain:  Filed Vitals:   06/21/15 1357  PainSc: 0-No pain                 Kennieth Rad

## 2015-06-21 NOTE — Progress Notes (Signed)
 Patient Name: Wanda Trujillo Date of Encounter: 06/21/2015  Hospital Problem List     Principal Problem:   Atrial fibrillation with RVR (HCC) Active Problems:   PAF (paroxysmal atrial fibrillation) (HCC)   NICM (nonischemic cardiomyopathy) (HCC)   Chronic combined systolic and diastolic CHF (congestive heart failure) (HCC)   Morbid obesity (HCC)   Essential hypertension   Acute on chronic combined systolic and diastolic congestive heart failure, NYHA class 2 (HCC)    Subjective   Breathing still fine - no PND/orthopnea.  But still notes that she gets over dyspneic when she walks around. Rate is still not fully controlled.  Inpatient Medications    . apixaban  5 mg Oral BID  . lisinopril  5 mg Oral Daily  . metoprolol  5 mg Intravenous Once  . metoprolol tartrate  75 mg Oral BID  . sodium chloride flush  3 mL Intravenous Q12H    Vital Signs    Filed Vitals:   06/20/15 1312 06/20/15 2020 06/21/15 0427 06/21/15 0938  BP: 119/77 130/74 118/65 126/94  Pulse: 81 89 102   Temp: 98.9 F (37.2 C) 97.7 F (36.5 C) 98 F (36.7 C)   TempSrc: Oral Oral Oral   Resp: 20 18 17   Height:      Weight:      SpO2: 100% 100% 99%     Intake/Output Summary (Last 24 hours) at 06/21/15 1047 Last data filed at 06/20/15 1900  Gross per 24 hour  Intake    720 ml  Output      0 ml  Net    720 ml   Filed Weights   06/18/15 1353 06/18/15 2101  Weight: 265 lb (120.203 kg) 269 lb 8 oz (122.244 kg)    Physical Exam    General: Pleasant, NAD.  Neuro: Alert and oriented X 3. Moves all extremities spontaneously. Psych: Normal mood & affect. HEENT: [place]/AT, EOMI, MMM, anicteric sclera  Neck: Supple without bruits or JVD. Lungs:  Resp regular and unlabored, CTAB. Heart: Irreg-Irreg - rate somewhat improved; distant, but normal S1 & S2.  No M/R/G  - unable to palpate PMI due to body habitus. Abdomen: Soft, non-tender, non-distended, BS + x 4.  Extremities: No clubbing, cyanosis or  edema. DP/PT/Radials 2+ and equal bilaterally.  Labs    CBC  Recent Labs  06/18/15 1444 06/19/15 0221  WBC 4.6 4.1  HGB 10.6* 9.9*  HCT 34.8* 32.4*  MCV 77.9* 77.0*  PLT 157 154   Basic Metabolic Panel  Recent Labs  06/19/15 0221 06/21/15 0344 06/21/15 0752  NA 140  --  141  K 3.4*  --  3.8  CL 105  --  106  CO2 25  --  26  GLUCOSE 117*  --  108*  BUN 10  --  9  CREATININE 0.76  --  0.61  CALCIUM 8.5*  --  8.7*  MG 1.7 1.7  --    Liver Function Tests  Recent Labs  06/18/15 1444  AST 55*  ALT 30  ALKPHOS 68  BILITOT 0.7  PROT 6.9  ALBUMIN 3.2*   No results for input(s): LIPASE, AMYLASE in the last 72 hours. Cardiac Enzymes  Recent Labs  06/18/15 2058 06/19/15 0221 06/19/15 0818  TROPONINI <0.03 <0.03 <0.03   BNP Invalid input(s): POCBNP D-Dimer No results for input(s): DDIMER in the last 72 hours. Hemoglobin A1C No results for input(s): HGBA1C in the last 72 hours. Fasting Lipid Panel No results for   input(s): CHOL, HDL, LDLCALC, TRIG, CHOLHDL, LDLDIRECT in the last 72 hours. Thyroid Function Tests  Recent Labs  06/18/15 1444  TSH 1.172    Telemetry    Afib ranging from 80s-120 bpm  ECG    Not done today  Radiology    Dg Chest 2 View  06/18/2015  CLINICAL DATA:  Cough with shortness breath and chest pain for 1 week. EXAM: CHEST  2 VIEW COMPARISON:  Portable chest 12/10/2014. FINDINGS: The heart size appears stable at the upper limits of normal. The mediastinal contours are normal. The lungs appear unchanged. There is no edema, confluent airspace opacity or pleural effusion. No acute or significant osseous findings are seen. IMPRESSION: Stable chest.  No active cardiopulmonary process. Electronically Signed   By: Carey Bullocks M.D.   On: 06/18/2015 14:32    Assessment & Plan    Principal Problem:   Atrial fibrillation with RVR (HCC) - rate improved on CCB, but remains in Afib =- rate is somewhat better controlled now.    Converted to by mouth Lopressor yesterday with a one-time dose of diltiazem. Rate was doing relatively well but is increasing this morning. She just now received her oral metoprolol.   We'll give one-time IV metoprolol 5 mg and increased dose to 75 mg twice a day.  Has been on Eliquis since admission.  Plan TEE/DCCV today. If successful, would anticipate discharge later on today.   Active Problems:   PAF (paroxysmal atrial fibrillation) (HCC) - she stopped taking her home CCB & Eliquis  Converted to beta blocker orally. Preferable with reduced EF. However would eventually like to use long-acting beta blocker based   Has reduced EF & is symptomatic when in Afib. - Will have her f/u in Afib Clinic to determine +/- benefit of possible antiarrythmic agent.    NICM (nonischemic cardiomyopathy) (HCC) / Chronic combined systolic and diastolic CHF (congestive heart failure) (HCC) - prior Myoview, negative   we will recheck outpatient echo as she is relatively asymptomatic with improved rate control  Sx improved with rate control; lying flat --> seems euvolemic   will add very low-dose Lasix to be used when necessary.  Prefer use of BB over CCB for rate control  -- we will not prescribe diltiazem on discharge.  Is on ACE-I      Morbid obesity (HCC) - will need nutrition counseling.   Essential hypertension -- on ACE-I.  and now beta blocker at 75 mg twice a day. Well-controlled..   Plan: TEE guided Cardioversion today. If successful, would discharge later on today on Propulsid 5 mg twice a day and ELIQUIS along with home dose of ACE inhibitor.  She will follow-up in the atrial fibrillation clinic to determine potential benefit from antiarrhythmic agent versus rate control - and also the possibility of atrial fibrillation ablation    Signed, Herbie Baltimore, Piedad Climes, M.D., M.S. Interventional Cardiologist   Pager # [phone] Phone # [phone] [address] [place], [place] [address]

## 2015-06-21 NOTE — H&P (View-Only) (Signed)
Patient Name: Wanda Trujillo Date of Encounter: 06/21/2015  Hospital Problem List     Principal Problem:   Atrial fibrillation with RVR (HCC) Active Problems:   PAF (paroxysmal atrial fibrillation) (HCC)   NICM (nonischemic cardiomyopathy) (HCC)   Chronic combined systolic and diastolic CHF (congestive heart failure) (HCC)   Morbid obesity (HCC)   Essential hypertension   Acute on chronic combined systolic and diastolic congestive heart failure, NYHA class 2 (HCC)    Subjective   Breathing still fine - no PND/orthopnea.  But still notes that she gets over dyspneic when she walks around. Rate is still not fully controlled.  Inpatient Medications    . apixaban  5 mg Oral BID  . lisinopril  5 mg Oral Daily  . metoprolol  5 mg Intravenous Once  . metoprolol tartrate  75 mg Oral BID  . sodium chloride flush  3 mL Intravenous Q12H    Vital Signs    Filed Vitals:   06/20/15 1312 06/20/15 2020 06/21/15 0427 06/21/15 0938  BP: 119/77 130/74 118/65 126/94  Pulse: 81 89 102   Temp: 98.9 F (37.2 C) 97.7 F (36.5 C) 98 F (36.7 C)   TempSrc: Oral Oral Oral   Resp: Height:      Weight:      SpO2: 100% 100% 99%     Intake/Output Summary (Last 24 hours) at 06/21/15 1047 Last data filed at 06/20/15 1900  Gross per 24 hour  Intake    720 ml  Output      0 ml  Net    720 ml   Filed Weights   06/18/15 1353 06/18/15 2101  Weight: 265 lb (120.203 kg) 269 lb 8 oz (122.244 kg)    Physical Exam    General: Pleasant, NAD.  Neuro: Alert and oriented X 3. Moves all extremities spontaneously. Psych: Normal mood & affect. HEENT: [place]/AT, EOMI, MMM, anicteric sclera  Neck: Supple without bruits or JVD. Lungs:  Resp regular and unlabored, CTAB. Heart: Irreg-Irreg - rate somewhat improved; distant, but normal S1 & S2.  No M/R/G  - unable to palpate PMI due to body habitus. Abdomen: Soft, non-tender, non-distended, BS + x 4.  Extremities: No clubbing, cyanosis or  edema. DP/PT/Radials 2+ and equal bilaterally.  Labs    CBC  Recent Labs  06/18/15 1444 06/19/15 0221  WBC 4.6 4.1  HGB 10.6* 9.9*  HCT 34.8* 32.4*  MCV 77.9* 77.0*  PLT 157 154   Basic Metabolic Panel  Recent Labs  06/19/15 0221 06/21/15 0344 06/21/15 0752  NA 140  --  141  K 3.4*  --  3.8  CL 105  --  106  CO2 25  --  26  GLUCOSE 117*  --  108*  BUN 10  --  9  CREATININE 0.76  --  0.61  CALCIUM 8.5*  --  8.7*  MG 1.7 1.7  --    Liver Function Tests  Recent Labs  06/18/15 1444  AST 55*  ALT 30  ALKPHOS 68  BILITOT 0.7  PROT 6.9  ALBUMIN 3.2*   No results for input(s): LIPASE, AMYLASE in the last 72 hours. Cardiac Enzymes  Recent Labs  06/18/15 2058 06/19/15 0221 06/19/15 0818  TROPONINI <0.03 <0.03 <0.03   BNP Invalid input(s): POCBNP D-Dimer No results for input(s): DDIMER in the last 72 hours. Hemoglobin A1C No results for input(s): HGBA1C in the last 72 hours. Fasting Lipid Panel No results for  input(s): CHOL, HDL, LDLCALC, TRIG, CHOLHDL, LDLDIRECT in the last 72 hours. Thyroid Function Tests  Recent Labs  06/18/15 1444  TSH 1.172    Telemetry    Afib ranging from 80s-120 bpm  ECG    Not done today  Radiology    Dg Chest 2 View  06/18/2015  CLINICAL DATA:  Cough with shortness breath and chest pain for 1 week. EXAM: CHEST  2 VIEW COMPARISON:  Portable chest 12/10/2014. FINDINGS: The heart size appears stable at the upper limits of normal. The mediastinal contours are normal. The lungs appear unchanged. There is no edema, confluent airspace opacity or pleural effusion. No acute or significant osseous findings are seen. IMPRESSION: Stable chest.  No active cardiopulmonary process. Electronically Signed   By: Carey Bullocks M.D.   On: 06/18/2015 14:32    Assessment & Plan    Principal Problem:   Atrial fibrillation with RVR (HCC) - rate improved on CCB, but remains in Afib =- rate is somewhat better controlled now.    Converted to by mouth Lopressor yesterday with a one-time dose of diltiazem. Rate was doing relatively well but is increasing this morning. She just now received her oral metoprolol.   We'll give one-time IV metoprolol 5 mg and increased dose to 75 mg twice a day.  Has been on Eliquis since admission.  Plan TEE/DCCV today. If successful, would anticipate discharge later on today.   Active Problems:   PAF (paroxysmal atrial fibrillation) (HCC) - she stopped taking her home CCB & Eliquis  Converted to beta blocker orally. Preferable with reduced EF. However would eventually like to use long-acting beta blocker based   Has reduced EF & is symptomatic when in Afib. - Will have her f/u in Afib Clinic to determine +/- benefit of possible antiarrythmic agent.    NICM (nonischemic cardiomyopathy) (HCC) / Chronic combined systolic and diastolic CHF (congestive heart failure) (HCC) - prior Myoview, negative   we will recheck outpatient echo as she is relatively asymptomatic with improved rate control  Sx improved with rate control; lying flat --> seems euvolemic   will add very low-dose Lasix to be used when necessary.  Prefer use of BB over CCB for rate control  -- we will not prescribe diltiazem on discharge.  Is on ACE-I      Morbid obesity (HCC) - will need nutrition counseling.   Essential hypertension -- on ACE-I.  and now beta blocker at 75 mg twice a day. Well-controlled..   Plan: TEE guided Cardioversion today. If successful, would discharge later on today on Propulsid 5 mg twice a day and ELIQUIS along with home dose of ACE inhibitor.  She will follow-up in the atrial fibrillation clinic to determine potential benefit from antiarrhythmic agent versus rate control - and also the possibility of atrial fibrillation ablation    Signed, Herbie Baltimore, Piedad Climes, M.D., M.S. Interventional Cardiologist   Pager # [phone] Phone # [phone] [address] [place], [place] [address]

## 2015-06-21 NOTE — Discharge Summary (Signed)
Discharge Summary    Patient ID: Wanda Trujillo,  MRN: [id number], DOB/AGE: 1960-08-27 55 y.o.  Admit date: 06/18/2015 Discharge date: 06/21/2015  Primary Care Provider: Egbert Garibaldi Primary Cardiologist: Dr. Herbie Baltimore [facility])  Discharge Diagnoses    Principal Problem:   Atrial fibrillation with RVR [facility]) Active Problems:   PAF (paroxysmal atrial fibrillation) (HCC)   NICM (nonischemic cardiomyopathy) (HCC)   Chronic combined systolic and diastolic CHF (congestive heart failure) (HCC)   Morbid obesity (HCC)   Essential hypertension   Acute on chronic combined systolic and diastolic congestive heart failure, NYHA class 2 (HCC)   History of Present Illness     Wanda Trujillo is a 55 y.o. female with past medical history of PAF (diagnosed in 11/2014 s/p TEE/DCCV, placed on Eliquis), NICM (EF 40-45% by echo in 11/2014), HTN, and prior tobacco abuse who presented to [facility] ED on 06/18/2015 for evaluation of chest pain and palpitations starting last Thursday.  The patient reports having a cough and sinus congestion for the past several weeks. Starting on Thursday (06/14/2015), she developed shortness of breath, palpitations, and began to feel lightheaded and dizzy. Her symptoms  continued to progress until she developed chest pain earlier that day and her family urged her to come to the ED.   She quit her Eliquis along with Coreg, Cardizem CD, and Lisinopril back in 02/2015 due to financial restrictions and not having insurance. She has not followed up with Cardiology since her hospitalization in 11/2014.   Initial EKG shows atrial fibrillation with RVR and HR of 146. She was started on IV Cardizem while in the ED with her HR improved into the  90's - 110's. She was restarted on Eliquis 5mg  BID.    Hospital Course     Consultants: None  On 06/19/2015, she reported her breathing was close to baseline. Her HR was still becoming elevated with activity. Cyclic  troponin values were negative. She remained on IV Cardizem and continued to receive Eliquis dosing prior to a potential TEE/DCCV.   On 06/20/2015, she remained in atrial fibrillation with her HR somewhat better controlled. Endoscopy had no availability for a TEE/DCCV, therefore this was scheduled for 06/21/2015. Her Cardizem dosing was switched to PO and she was bridged to PO Metoprolol with her known reduced EF and wanting to avoid CCB use in known reduced EF.   On 06/21/2015, her HR was ranging from 80's-120's. She underwent successful cardioversion later that day with 150J. No LA thrombus was noted. EF was noted to be 30-35% at that time.   She was last examined by Dr. Herbie Baltimore and deemed stable for discharge. She will be discharged on Eliquis 5mg  BID for anticoagulation due to her CHA2DS2-VASc Score of 3 (CHF, HTN, Female). For rate control, she was placed on Lopressor 75mg  BID. Will remain on Lisinopril with known reduced EF and for HTN.     She will need a repeat echocardiogram as an outpatient in several months to reassess her EF. Follow-up in the atrial fibrillation clinic has been arranged to determine potential benefit from antiarrhythmic agents versus rate control and also the possibility of atrial fibrillation ablation. The patient was discharged home in stable condition.  _____________  Discharge Vitals Blood pressure 104/64, pulse 79, temperature 97.6 F (36.4 C), temperature source Oral, resp. rate 18, height 5\' 4"  (1.626 m), weight 269 lb 8 oz (122.244 kg), last menstrual period 05/25/2015, SpO2 100 %.  Filed Weights   06/18/15 1353 06/18/15 2101  Weight: 265 lb (120.203 kg) 269 lb 8 oz (122.244 kg)    Labs & Radiologic Studies     CBC  Recent Labs  06/19/15 0221  WBC 4.1  HGB 9.9*  HCT 32.4*  MCV 77.0*  PLT 154   Basic Metabolic Panel  Recent Labs  06/19/15 0221 06/21/15 0344 06/21/15 0752  NA 140  --  141  K 3.4*  --  3.8  CL 105  --  106  CO2 25  --  26    GLUCOSE 117*  --  108*  BUN 10  --  9  CREATININE 0.76  --  0.61  CALCIUM 8.5*  --  8.7*  MG 1.7 1.7  --    Liver Function Tests No results for input(s): AST, ALT, ALKPHOS, BILITOT, PROT, ALBUMIN in the last 72 hours. No results for input(s): LIPASE, AMYLASE in the last 72 hours. Cardiac Enzymes  Recent Labs  06/18/15 2058 06/19/15 0221 06/19/15 0818  TROPONINI <0.03 <0.03 <0.03    Dg Chest 2 View: 06/18/2015  CLINICAL DATA:  Cough with shortness breath and chest pain for 1 week. EXAM: CHEST  2 VIEW COMPARISON:  Portable chest 12/10/2014. FINDINGS: The heart size appears stable at the upper limits of normal. The mediastinal contours are normal. The lungs appear unchanged. There is no edema, confluent airspace opacity or pleural effusion. No acute or significant osseous findings are seen. IMPRESSION: Stable chest.  No active cardiopulmonary process. Electronically Signed   By: Carey Bullocks M.D.   On: 06/18/2015 14:32    Diagnostic Studies/Procedures     Transesophageal Echocardiogram: 06/21/2015 Study Conclusions  - Left ventricle: The estimated ejection fraction was in the range  of 30% to 35%. No evidence of thrombus. - Aortic valve: No evidence of vegetation. - Mitral valve: No evidence of vegetation. There was mild  regurgitation. - Left atrium: No evidence of thrombus in the atrial cavity or  appendage. No evidence of thrombus in the appendage. There was  spontaneous echo contrast (&quot;smoke&quot;). - Right atrium: No evidence of thrombus in the atrial cavity or  appendage. - Tricuspid valve: No evidence of vegetation. - Pulmonic valve: No evidence of vegetation. - Superior vena cava: The study excluded a thrombus.  Impressions: - Successful cardioversion. No cardiac source of emboli was  indentified.  Disposition   Pt is being discharged home today in good condition.  Follow-up Plans & Appointments    Follow-up Information    Follow up with [facility] On 06/26/2015.   Why:  Appointment scheduled for follow up care at [facility] Tuesday 5/2 at 3:30pm   Contact information:   [address] [place] [address] [phone]      Follow up with Rudi Coco, NP On 07/10/2015.   Specialties:  Nurse Practitioner, Cardiology   Why:  Cardiology Hospital Follow-Up on 07/10/2015 at 11:30AM. Located in the Heart and Vascular Center at [facility]. Parking Code for garage is 0002.   Contact information:   [address] [place] [address] [phone]        Discharge Medications   Current Discharge Medication List    CONTINUE these medications which have NOT CHANGED   Details  apixaban (ELIQUIS) 5 MG TABS tablet Take 1 tablet (5 mg total) by mouth 2 (two) times daily. Qty: 60 tablet, Refills: 6    carvedilol (COREG) 3.125 MG tablet Take 1 tablet (3.125 mg total) by mouth 2 (two)  times daily with a meal. Qty: 60 tablet, Refills: 3    diltiazem (CARDIZEM CD) 180 MG 24 hr capsule Take 1 capsule (180 mg total) by mouth daily. Qty: 30 capsule, Refills: 3    lisinopril (PRINIVIL,ZESTRIL) 5 MG tablet Take 1 tablet (5 mg total) by mouth daily. Qty: 30 tablet, Refills: 3         Allergies No Known Allergies   Outstanding Labs/Studies   None  Duration of Discharge Encounter   Greater than 30 minutes including physician time.  Signed, Ellsworth Lennox, PA-C 06/21/2015, 5:04 PM

## 2015-06-22 MED FILL — !ELIQUIS 5MG TABLET: 5 | 30 days supply | Qty: 60 | Fill #0

## 2015-06-22 MED FILL — LISINOPRIL 5 MG TABLET: 5 | 30 days supply | Qty: 30 | Fill #0

## 2015-06-22 MED FILL — ?METOPROLOL 50 MG TABLET: 50 | 30 days supply | Qty: 90 | Fill #0

## 2015-06-23 ENCOUNTER — Encounter (HOSPITAL_COMMUNITY): Payer: Self-pay | Admitting: Cardiology

## 2015-06-26 ENCOUNTER — Ambulatory Visit: Payer: Self-pay | Attending: Family Medicine | Admitting: Family Medicine

## 2015-06-26 ENCOUNTER — Encounter: Payer: Self-pay | Admitting: Family Medicine

## 2015-06-26 VITALS — BP 143/88 | HR 70 | Temp 98.3°F | Resp 16 | Ht 64.0 in | Wt 263.2 lb

## 2015-06-26 DIAGNOSIS — I4891 Unspecified atrial fibrillation: Secondary | ICD-10-CM

## 2015-06-26 DIAGNOSIS — I5042 Chronic combined systolic (congestive) and diastolic (congestive) heart failure: Secondary | ICD-10-CM

## 2015-06-26 DIAGNOSIS — I429 Cardiomyopathy, unspecified: Secondary | ICD-10-CM

## 2015-06-26 DIAGNOSIS — Z6841 Body Mass Index (BMI) 40.0 and over, adult: Secondary | ICD-10-CM | POA: Insufficient documentation

## 2015-06-26 DIAGNOSIS — I428 Other cardiomyopathies: Secondary | ICD-10-CM

## 2015-06-26 DIAGNOSIS — Z79899 Other long term (current) drug therapy: Secondary | ICD-10-CM | POA: Insufficient documentation

## 2015-06-26 DIAGNOSIS — Z7901 Long term (current) use of anticoagulants: Secondary | ICD-10-CM | POA: Insufficient documentation

## 2015-06-26 NOTE — Progress Notes (Signed)
Subjective:  Patient ID: Wanda Trujillo, female    DOB: 1960-03-01  Age: 55 y.o. MRN: [id number]  CC: Hospitalization Follow-up; Congestive Heart Failure; and Atrial Fibrillation   HPI Ahilyn Gingras is a 55 year old female with a history of nonischemic cardiomyopathy, congestive heart failure (EF 30-35%), atrial fibrillation who comes into the clinic to establish care after hospitalization at [facility] from 4/24/7 through 06/21/15.  She had presented to the ED with chest pains, palpitations and had been out of medications and not compliant with cardiology follow-up. EKG revealed A. fib with RVR and a heart rate of 146. She was placed on Cardizem and Eliquis and subsequently underwent a successful TEE/cardioversion to normal sinus rhythm and was subsequently scheduled to follow-up with cardiology outpatient.  Today she reports feeling well and has no complaints at this time and has all her medications with her which she has been compliant with.  Outpatient Prescriptions Prior to Visit  Medication Sig Dispense Refill  . apixaban (ELIQUIS) 5 MG TABS tablet Take 1 tablet (5 mg total) by mouth 2 (two) times daily. 60 tablet 7  . lisinopril (PRINIVIL,ZESTRIL) 5 MG tablet Take 1 tablet (5 mg total) by mouth daily. 30 tablet 7  . metoprolol (LOPRESSOR) 50 MG tablet Take 1.5 tablets (75 mg total) by mouth 2 (two) times daily. 90 tablet 7   No facility-administered medications prior to visit.    ROS Review of Systems  Constitutional: Negative for activity change, appetite change and fatigue.  HENT: Negative for congestion, sinus pressure and sore throat.   Eyes: Negative for visual disturbance.  Respiratory: Negative for cough, chest tightness, shortness of breath and wheezing.   Cardiovascular: Negative for chest pain and palpitations.  Gastrointestinal: Negative for abdominal pain, constipation and abdominal distention.  Endocrine: Negative for polydipsia.  Genitourinary: Negative for  dysuria and frequency.  Musculoskeletal: Negative for back pain and arthralgias.  Skin: Negative for rash.  Neurological: Negative for tremors, light-headedness and numbness.  Hematological: Does not bruise/bleed easily.  Psychiatric/Behavioral: Negative for behavioral problems and agitation.    Objective:  BP 143/88 mmHg  Pulse 70  Temp(Src) 98.3 F (36.8 C) (Oral)  Resp 16  Ht 5\' 4"  (1.626 m)  Wt 263 lb 3.2 oz (119.387 kg)  BMI 45.16 kg/m2  SpO2 99%  LMP 05/25/2015  BP/Weight 06/26/2015 06/21/2015 06/18/2015  Systolic BP 143 104 -  Diastolic BP 88 64 -  Wt. (Lbs) 263.2 - 269.5  BMI 45.16 - 46.24     Physical Exam  Constitutional: She is oriented to person, place, and time. She appears well-developed and well-nourished.  Morbidly obese  Cardiovascular: Normal rate, normal heart sounds and intact distal pulses.   No murmur heard. Pulmonary/Chest: Effort normal and breath sounds normal. She has no wheezes. She has no rales. She exhibits no tenderness.  Abdominal: Soft. Bowel sounds are normal. She exhibits no distension and no mass. There is no tenderness.  Musculoskeletal: Normal range of motion.  Neurological: She is alert and oriented to person, place, and time.     Assessment & Plan:   1. NICM (nonischemic cardiomyopathy) (HCC)  2. Atrial fibrillation with RVR (HCC) Currently on rate control with metoprolol and anticoagulation with Eliquis Advised to stop by the pharmacy and has also application for patient's assistant program can be completed for Eliquis  3. Chronic combined systolic and diastolic CHF (congestive heart failure) (HCC) EF of 30-35% from TEE of 06/21/15 No evidence of fluid overload  4. Morbid obesity due to excess  calories (HCC) Advised on increasing physical activity, reducing portion sizes and approaches to ensure weight loss.   No orders of the defined types were placed in this encounter.    Follow-up: Return in about 6 weeks (around  08/07/2015) for follow up on Afib.   Jaclyn Shaggy MD

## 2015-06-26 NOTE — Progress Notes (Signed)
Patient's here for hospital f/up CHF and A-fib. Patient states she's feeling pretty good today.  Patient denies any pain today.

## 2015-07-10 ENCOUNTER — Ambulatory Visit (HOSPITAL_COMMUNITY)
Admission: RE | Admit: 2015-07-10 | Discharge: 2015-07-10 | Disposition: A | Discharge status: Home / Self Care | Payer: Self-pay | Source: Ambulatory Visit | Attending: Nurse Practitioner | Admitting: Nurse Practitioner

## 2015-07-10 ENCOUNTER — Encounter (HOSPITAL_COMMUNITY): Payer: Self-pay | Admitting: Nurse Practitioner

## 2015-07-10 VITALS — BP 122/78 | HR 67 | Ht 64.0 in | Wt 266.0 lb

## 2015-07-10 DIAGNOSIS — Z79899 Other long term (current) drug therapy: Secondary | ICD-10-CM | POA: Insufficient documentation

## 2015-07-10 DIAGNOSIS — Z6841 Body Mass Index (BMI) 40.0 and over, adult: Secondary | ICD-10-CM | POA: Insufficient documentation

## 2015-07-10 DIAGNOSIS — I428 Other cardiomyopathies: Secondary | ICD-10-CM | POA: Insufficient documentation

## 2015-07-10 DIAGNOSIS — I110 Hypertensive heart disease with heart failure: Secondary | ICD-10-CM | POA: Insufficient documentation

## 2015-07-10 DIAGNOSIS — I480 Paroxysmal atrial fibrillation: Secondary | ICD-10-CM | POA: Insufficient documentation

## 2015-07-10 DIAGNOSIS — Z8249 Family history of ischemic heart disease and other diseases of the circulatory system: Secondary | ICD-10-CM | POA: Insufficient documentation

## 2015-07-10 DIAGNOSIS — Z7901 Long term (current) use of anticoagulants: Secondary | ICD-10-CM | POA: Insufficient documentation

## 2015-07-10 DIAGNOSIS — Z87891 Personal history of nicotine dependence: Secondary | ICD-10-CM | POA: Insufficient documentation

## 2015-07-10 DIAGNOSIS — I5022 Chronic systolic (congestive) heart failure: Secondary | ICD-10-CM | POA: Insufficient documentation

## 2015-07-10 NOTE — Progress Notes (Addendum)
Patient ID: Wanda Trujillo, female   DOB: 1960-12-13, 55 y.o.   MRN: [id number]     Primary Care Physician: Egbert Garibaldi, NP Referring Physician:[facility] F/U   Wanda Trujillo is a 55 y.o. female with a h/o afib, 10/16, morbid obesity, s/p gastric sleep, negative sleep study in the past, that recently was treated at [facility] for afib. She had run out of medications, in January, due to cost issues and not having insurance and had returned to afib. Echo did show that EF was down to 30-35% probably due to [facility], It was mentioned that she would be changed to carvedilol but she ws discharged on lopressor but has ben doing very well without any afib. D/C orders suggested to repeat echo in 2-3 months to see if EF was still reduced if she maintains SR. She has gained around 35 lbs since gastric surgery. She drinks a lot of sweetened drinks, including caffeine, but still can only eat small amounts of foods since gastric sleeve surgery. Does not smoke or use alcohol. Denies any current snoring or apnea. Has a chadsvasc score of 3 and knows the importance of taking eliquis without fail.  Today, she denies symptoms of palpitations, chest pain, shortness of breath, orthopnea, PND, lower extremity edema, dizziness, presyncope, syncope, or neurologic sequela. The patient is tolerating medications without difficulties and is otherwise without complaint today.   Past Medical History  Diagnosis Date  . NICM (nonischemic cardiomyopathy) (HCC)     a. Dx in 2010 in [place] - no prior cath; patient reports prior negative stress tests. b. 2D ECHO with EF 40-45%, mild concentric hypertrophy. Diffuse hypokinesis. There is akinesis of the basal inferior myocardium.  . Essential hypertension   . PAF (paroxysmal atrial fibrillation) (HCC)     a. new dx on 11/2014 admission. s/p TEE/DCCV. placed on Eliquis b. TEE/DCCV 06/21/2015  . Morbid obesity (HCC)   . Anemia     a. Noted on labs 11/2014, no prior to compare to.  . Chronic  systolic CHF (congestive heart failure) (HCC)   . Former tobacco use   . Shortness of breath dyspnea    Past Surgical History  Procedure Laterality Date  . Laparoscopic gastric sleeve resection N/A     2012  . Tee without cardioversion N/A 12/11/2014    Procedure: TRANSESOPHAGEAL ECHOCARDIOGRAM (TEE);  Surgeon: Chilton Si, MD;  Location: [facility] ENDOSCOPY;  Service: Cardiovascular;  Laterality: N/A;  . Cardioversion N/A 12/11/2014    Procedure: CARDIOVERSION;  Surgeon: Chilton Si, MD;  Location: [facility] ENDOSCOPY;  Service: Cardiovascular;  Laterality: N/A;  . Tee without cardioversion N/A 06/21/2015    Procedure: TRANSESOPHAGEAL ECHOCARDIOGRAM (TEE);  Surgeon: Jake Bathe, MD;  Location: [facility] ENDOSCOPY;  Service: Cardiovascular;  Laterality: N/A;  . Cardioversion N/A 06/21/2015    Procedure: CARDIOVERSION;  Surgeon: Jake Bathe, MD;  Location: [facility] ENDOSCOPY;  Service: Cardiovascular;  Laterality: N/A;    Current Outpatient Prescriptions  Medication Sig Dispense Refill  . apixaban (ELIQUIS) 5 MG TABS tablet Take 1 tablet (5 mg total) by mouth 2 (two) times daily. 60 tablet 7  . lisinopril (PRINIVIL,ZESTRIL) 5 MG tablet Take 1 tablet (5 mg total) by mouth daily. 30 tablet 7  . metoprolol (LOPRESSOR) 50 MG tablet Take 1.5 tablets (75 mg total) by mouth 2 (two) times daily. 90 tablet 7   No current facility-administered medications for this encounter.    No Known Allergies  Social History   Social History  . Marital Status: Married  Spouse Name: N/A  . Number of Children: N/A  . Years of Education: N/A   Occupational History  . Not on file.   Social History Main Topics  . Smoking status: Former Games developer  . Smokeless tobacco: Never Used     Comment: QUIT SMOKING IN 2012  . Alcohol Use: Yes     Comment: occ  . Drug Use: No  . Sexual Activity: Not on file   Other Topics Concern  . Not on file   Social History Narrative    Family History  Problem Relation Age of  Onset  . Hypertension Mother     ROS- All systems are reviewed and negative except as per the HPI above  Physical Exam: Filed Vitals:   07/10/15 1153  BP: 122/78  Pulse: 67  Height:  (1.626 m)  Weight: 266 lb (120.657 kg)    GEN- The patient is well appearing, alert and oriented x 3 today.   Head- normocephalic, atraumatic Eyes-  Sclera clear, conjunctiva pink Ears- hearing intact Oropharynx- clear Neck- supple, no JVP Lymph- no cervical lymphadenopathy Lungs- Clear to ausculation bilaterally, normal work of breathing Heart- Regular rate and rhythm, no murmurs, rubs or gallops, PMI not laterally displaced GI- soft, NT, ND, + BS Extremities- no clubbing, cyanosis, or edema MS- no significant deformity or atrophy Skin- no rash or lesion Psych- euthymic mood, full affect Neuro- strength and sensation are intact  EKG-NSR at 67 bpm, pr int 152 ms, 88 ms, qtc 437 bpm Epic records reviewed  Assessment and Plan: 1. PAF Doing well with current meds and is maintaining SR There was some discussion re change to coreg from lopressor for decreased EF, on d/c instructions, but since she is doing so well, will bring back in 2 months and repeat echo at that time and if EF is still down will make change over to coreg. I suspect if she can maintain SR, EF will normalize Continue BB Continue eliquis  2. Lifestyle issues Encouraged to lose 5-10% of current body weight Start walking program Eliminate sweet drinks   F/u in 2 months sooner if any return of afib  Lupita Leash C. [facility] [address] [place], [place] [address] [phone]

## 2015-07-10 NOTE — Addendum Note (Signed)
Encounter addended by: Newman Nip, NP on: 07/10/2015  2:33 PM<BR>     Documentation filed: Notes Section

## 2015-07-13 ENCOUNTER — Other Ambulatory Visit: Payer: Self-pay

## 2015-07-13 MED ORDER — APIXABAN 5 MG PO TABS: 5.0000 mg | ORAL_TABLET | Freq: Two times a day (BID) | ORAL | Status: DC

## 2015-07-27 MED FILL — LISINOPRIL 5 MG TABLET: 5 | 30 days supply | Qty: 30 | Fill #1

## 2015-07-27 MED FILL — ?METOPROLOL 50 MG TABLET: 50 | 30 days supply | Qty: 90 | Fill #1

## 2015-07-27 MED FILL — ELIQUIS 5 MG TABLET: 5 | 30 days supply | Qty: 60 | Fill #1

## 2015-09-11 ENCOUNTER — Encounter (HOSPITAL_COMMUNITY): Payer: Self-pay | Admitting: *Deleted

## 2015-09-11 ENCOUNTER — Inpatient Hospital Stay (HOSPITAL_COMMUNITY): Admission: RE | Admit: 2015-09-11 | Payer: Self-pay | Source: Ambulatory Visit | Admitting: Nurse Practitioner

## 2015-10-18 MED FILL — !ELIQUIS 5MG TABLET: 5 | 30 days supply | Qty: 60 | Fill #2

## 2015-10-18 MED FILL — LISINOPRIL 5 MG TABLET: 5 | 30 days supply | Qty: 30 | Fill #2

## 2015-10-18 MED FILL — ?METOPROLOL 50 MG TABLET: 50 | 30 days supply | Qty: 90 | Fill #2

## 2015-12-04 MED FILL — LISINOPRIL 5 MG TABLET: 5 | 30 days supply | Qty: 30 | Fill #3

## 2015-12-04 MED FILL — !ELIQUIS 5MG TABLET: 5 | 28 days supply | Qty: 56 | Fill #3

## 2015-12-04 MED FILL — METOPROLOL TARTRATE 50 MG T: 50 | 30 days supply | Qty: 90 | Fill #3

## 2016-01-10 MED FILL — METOPROLOL TARTRATE 50 MG T: 50 | 30 days supply | Qty: 90 | Fill #4

## 2016-01-10 MED FILL — LISINOPRIL 5 MG TABLET: 5 | 30 days supply | Qty: 30 | Fill #4

## 2016-03-03 MED FILL — LISINOPRIL 5 MG TABLET: 5 | 30 days supply | Qty: 30 | Fill #5

## 2016-03-05 MED FILL — !ELIQUIS 5 MG TABLET: 5 | 30 days supply | Qty: 60 | Fill #0

## 2016-03-05 MED FILL — METOPROLOL TARTRATE 50 MG T: 50 | 30 days supply | Qty: 90 | Fill #5

## 2016-04-14 MED FILL — LISINOPRIL 5 MG TABLET: 5 | 30 days supply | Qty: 30 | Fill #6

## 2016-05-19 MED FILL — ?LISINOPRIL 5 MG TABLET: 5 | 30 days supply | Qty: 30 | Fill #7

## 2016-05-19 MED FILL — ?METOPROLOL 50 MG TABLET: 50 | 30 days supply | Qty: 90 | Fill #6

## 2016-05-19 MED FILL — !ELIQUIS 5 MG TABLET: 5 | 30 days supply | Qty: 60 | Fill #1

## 2016-06-06 ENCOUNTER — Other Ambulatory Visit: Payer: Self-pay

## 2016-06-06 DIAGNOSIS — I4891 Unspecified atrial fibrillation: Secondary | ICD-10-CM

## 2016-06-06 MED ORDER — APIXABAN 5 MG PO TABS: 5.0000 mg | ORAL_TABLET | Freq: Two times a day (BID) | ORAL | 3 refills | Status: AC

## 2016-07-02 MED FILL — ELIQUIS 5 MG TABLET: 5 | 15 days supply | Qty: 30 | Fill #2

## 2016-07-09 ENCOUNTER — Ambulatory Visit: Payer: Self-pay

## 2016-07-09 ENCOUNTER — Ambulatory Visit: Payer: Self-pay | Attending: Internal Medicine

## 2016-10-15 MED FILL — $ELIQUIS 5 MG TABLET: 5 | 30 days supply | Qty: 60 | Fill #0

## 2016-11-24 MED FILL — $ELIQUIS 5 MG TABLET: 5 | 30 days supply | Qty: 60 | Fill #1

## 2017-01-06 MED FILL — $ELIQUIS 5 MG TABLET: 5 | 30 days supply | Qty: 60 | Fill #2

## 2017-03-03 MED FILL — $ELIQUIS 5 MG TABLET: 5 | 30 days supply | Qty: 60 | Fill #3

## 2017-05-04 MED FILL — $ELIQUIS 5 MG TABLET: 5 | 30 days supply | Qty: 60 | Fill #4

## 2017-05-27 ENCOUNTER — Other Ambulatory Visit: Payer: Self-pay

## 2017-05-27 ENCOUNTER — Other Ambulatory Visit: Payer: Self-pay | Admitting: *Deleted

## 2017-05-27 DIAGNOSIS — Z1231 Encounter for screening mammogram for malignant neoplasm of breast: Secondary | ICD-10-CM

## 2017-06-16 ENCOUNTER — Ambulatory Visit
Admission: RE | Admit: 2017-06-16 | Discharge: 2017-06-16 | Disposition: A | Discharge status: Home / Self Care | Payer: BLUE CROSS/BLUE SHIELD | Source: Ambulatory Visit

## 2017-06-16 DIAGNOSIS — Z1231 Encounter for screening mammogram for malignant neoplasm of breast: Secondary | ICD-10-CM

## 2017-07-31 ENCOUNTER — Other Ambulatory Visit: Payer: Self-pay | Admitting: Family Medicine

## 2017-07-31 DIAGNOSIS — I4891 Unspecified atrial fibrillation: Secondary | ICD-10-CM

## 2018-05-06 ENCOUNTER — Encounter: Payer: Self-pay | Admitting: Infectious Diseases

## 2018-05-06 ENCOUNTER — Telehealth: Payer: Self-pay | Admitting: Pharmacy Technician

## 2018-05-06 ENCOUNTER — Ambulatory Visit (INDEPENDENT_AMBULATORY_CARE_PROVIDER_SITE_OTHER): Payer: BLUE CROSS/BLUE SHIELD | Admitting: Infectious Diseases

## 2018-05-06 ENCOUNTER — Other Ambulatory Visit: Payer: Self-pay

## 2018-05-06 DIAGNOSIS — R768 Other specified abnormal immunological findings in serum: Secondary | ICD-10-CM | POA: Insufficient documentation

## 2018-05-06 DIAGNOSIS — I10 Essential (primary) hypertension: Secondary | ICD-10-CM | POA: Diagnosis not present

## 2018-05-06 NOTE — Progress Notes (Signed)
Patient Name: Wanda Trujillo  Date of Birth: 1960-09-25  MRN: [id number]  PCP: Marva Panda, NP  Referring Provider: Marva Panda, NP   Patient Active Problem List   Diagnosis Date Noted  . Hepatitis C antibody positive in blood 05/06/2018  . Atrial fibrillation with RVR (HCC) 06/18/2015  . Acute on chronic combined systolic and diastolic congestive heart failure, NYHA class 2 (HCC) 06/18/2015  . Former tobacco use   . Chronic combined systolic and diastolic CHF (congestive heart failure) (HCC)   . Anemia   . Morbid obesity (HCC)   . Essential hypertension   . PAF (paroxysmal atrial fibrillation) (HCC)   . NICM (nonischemic cardiomyopathy) (HCC)    CC:  New patient here for initial evaluation and management of positive hepatitis c antibody in blood. Nervous today.    HPI:  Wanda Trujillo is a 58 y.o. female with pmhx including NICM, AFib (on Eliquis), HTN. She tells me that in January of this year at a follow up appointment with PCP she was found to have a positive Hep C Ab with titer ~6. She also had Hep C RNA assessed at this time and was undetectable in the blood (March 06 2018). She has not previously had testing and this was the first she learned about this infection. She has no risk factors (past or present) that would put her at risk for Hep C infection. She does have pre-diabetes and last liver function testing in January was normal. PLT counts also normal. She has no family history of liver disease or liver cancer. She drinks alcohol mostly on the weekends and does binge drink occasionally > 6 drinks. Does not smoke cigarettes.   ROS:  Constitutional: negative for fevers, chills, malaise and anorexia Eyes: negative for icterus Cardiovascular: negative for chest pain, lower extremity edema Gastrointestinal: negative for abdominal pain and jaundice Integument/breast: negative for rash, skin lesion(s) and pruritus Hematologic/lymphatic: negative for  petechiae, easy bruising/bleedin (on anticoag) Musculoskeletal: negative for myalgias and arthralgias All other systems reviewed and are negative      Past Medical History:  Diagnosis Date  . Anemia    a. Noted on labs 11/2014, no prior to compare to.  . Chronic systolic CHF (congestive heart failure) (HCC)   . Essential hypertension   . Former tobacco use   . Morbid obesity (HCC)   . NICM (nonischemic cardiomyopathy) (HCC)    a. Dx in 2010 in [place] - no prior cath; patient reports prior negative stress tests. b. 2D ECHO with EF 40-45%, mild concentric hypertrophy. Diffuse hypokinesis. There is akinesis of the basal inferior myocardium.  Marland Kitchen PAF (paroxysmal atrial fibrillation) (HCC)    a. new dx on 11/2014 admission. s/p TEE/DCCV. placed on Eliquis b. TEE/DCCV 06/21/2015  . Shortness of breath dyspnea    Outpatient Medications Prior to Visit  Medication Sig Dispense Refill  . apixaban (ELIQUIS) 5 MG TABS tablet Take 1 tablet (5 mg total) by mouth 2 (two) times daily. 180 tablet 3  . lisinopril (PRINIVIL,ZESTRIL) 5 MG tablet Take 1 tablet (5 mg total) by mouth daily. 30 tablet 7  . metoprolol (LOPRESSOR) 50 MG tablet Take 1.5 tablets (75 mg total) by mouth 2 (two) times daily. 90 tablet 7   No facility-administered medications prior to visit.     No Known Allergies  Social History   Tobacco Use  . Smoking status: Former Games developer  . Smokeless tobacco: Never Used  . Tobacco comment: QUIT SMOKING IN 2012  Substance  Use Topics  . Alcohol use: Yes    Comment: occ  . Drug use: No    Family History  Problem Relation Age of Onset  . Hypertension Mother   . Liver cancer Neg Hx   . Liver disease Neg Hx     Objective:  There were no vitals filed for this visit. Constitutional: in no apparent distress, oriented times 3 and anicteric Eyes: anicteric Cardiovascular: Cor RRR and No murmurs Respiratory: clear Gastrointestinal: Bowel sounds are normal, liver is not enlarged, spleen is  not enlarged Musculoskeletal: peripheral pulses normal, no pedal edema, no clubbing or cyanosis Skin: negative for - jaundice, spider hemangioma, telangiectasia, palmar erythema, ecchymosis and atrophy; no porphyria cutanea tarda Lymphatic: no cervical lymphadenopathy   Laboratory: Genotype: No results found for: HCVGENOTYPE HCV viral load: No results found for: HCVQUANT Lab Results  Component Value Date   WBC 4.1 06/19/2015   HGB 9.9 (L) 06/19/2015   HCT 32.4 (L) 06/19/2015   MCV 77.0 (L) 06/19/2015   PLT 154 06/19/2015    Lab Results  Component Value Date   CREATININE 0.61 06/21/2015   BUN 9 06/21/2015   NA 141 06/21/2015   K 3.8 06/21/2015   CL 106 06/21/2015   CO2 26 06/21/2015    Lab Results  Component Value Date   ALT 30 06/18/2015   AST 55 (H) 06/18/2015   ALKPHOS 68 06/18/2015    Lab Results  Component Value Date   INR 1.25 06/21/2015   BILITOT 0.7 06/18/2015   ALBUMIN 3.2 (L) 06/18/2015     Imaging:  Assessment & Plan:   Problem List Items Addressed This Visit      Unprioritized   Essential hypertension (Chronic)    BP out of range today. She just had lisinopril dose increased but she was also quite nervous coming to today's appointment. She will contact her PCP to discuss further.       Hepatitis C antibody positive in blood - Primary    New Patient with positive Hep C Ab with low titer and negative RNA test. Normal LFTs.   I discussed with the patient the lab findings that confirm chronic hepatitis C as well as the natural history and progression of disease including about 30% of people who develop cirrhosis of the liver if left untreated and once cirrhosis is established there is a 2-7% risk per year of liver cancer and liver failure.   She has no risk factors for re-infection concern. Explained that this may be a false positive Hep C antibody or evidence of previously cleared infection - we won't be able to tell. Explained that she does not need any  treatment or further monitoring for this unless she engages in more risky behaviors that would put her at risk.   Discussed that she should avoid binge drinking in general for her health considering her Heart Failure, Obesity and hypertension. She is obese - would recommend to reduce weight to help with pre-diabetes and overall liver health.   She understands and I welcomed / answered all questions today.  Follow up with PCP.          I spent 30 minutes with the patient including greater than 70% of time in face to face counsel of the patient re hepatitis c and the details described above and in coordination of their care.  Rexene Alberts, MSN, NP-C [facility]  [email or web address] Pager: [phone] Office: [phone]

## 2018-05-06 NOTE — Assessment & Plan Note (Signed)
New Patient with positive Hep C Ab with low titer and negative RNA test. Normal LFTs.   I discussed with the patient the lab findings that confirm chronic hepatitis C as well as the natural history and progression of disease including about 30% of people who develop cirrhosis of the liver if left untreated and once cirrhosis is established there is a 2-7% risk per year of liver cancer and liver failure.   She has no risk factors for re-infection concern. Explained that this may be a false positive Hep C antibody or evidence of previously cleared infection - we won't be able to tell. Explained that she does not need any treatment or further monitoring for this unless she engages in more risky behaviors that would put her at risk.   Discussed that she should avoid binge drinking in general for her health considering her Heart Failure, Obesity and hypertension. She is obese - would recommend to reduce weight to help with pre-diabetes and overall liver health.   She understands and I welcomed / answered all questions today.  Follow up with PCP.

## 2018-05-06 NOTE — Assessment & Plan Note (Signed)
BP out of range today. She just had lisinopril dose increased but she was also quite nervous coming to today's appointment. She will contact her PCP to discuss further.

## 2018-05-06 NOTE — Telephone Encounter (Signed)
[facility] Patient Advocate Encounter    Findings of the benefits investigation conducted this morning via test claims for the patient's upcoming appointment on 05/06/2018 @ 09:00 are as follows:   Insurance: BCBS active Estimated copay amount will be determined once labs return and medication selected Prior Authorization: will begin insurance process once medication is prescribed Mavyret preferred, Epclusa and Harvoni not covered on plan  [facility] Patient Advocate will follow up once patient arrives for their appointment.  Beulah Gandy, CPhT [facility]ty Pharmacy Patient [facility] Phone: [phone] Fax: [phone] 05/06/2018 8:50 AM

## 2018-05-06 NOTE — Patient Instructions (Signed)
Nice to met you today.   Happy to tell you that you do not need any medications as it looks like you either previously cleared this infection on your own (which can happen in about 30% of people) or you had a false positive test.   The positive blood test is called an antibody test - this only tells [place] that your immune system has memory of previous infection. It does not verify ongoing infection. If you previously cleared this infection with your own immune system this test will always be positive when checked again in the future.   Your confirmatory test was negative for any hepatitis c virus in your blood - this tells [place] that you do not need medications.   Happy to answer any further questions you or your healthcare team may have.   No follow up needed.   Would recommend to avoid alcohol and especially excessive drinking for your overall health (this can make weight higher, liver inflamed and blood pressure worse).

## 2018-06-21 ENCOUNTER — Encounter (HOSPITAL_COMMUNITY): Payer: Self-pay

## 2018-06-21 ENCOUNTER — Other Ambulatory Visit: Payer: Self-pay

## 2018-06-21 ENCOUNTER — Ambulatory Visit (HOSPITAL_COMMUNITY)
Admission: EM | Admit: 2018-06-21 | Discharge: 2018-06-21 | Disposition: A | Discharge status: Home / Self Care | Payer: BLUE CROSS/BLUE SHIELD | Source: Home / Self Care | Attending: Family Medicine | Admitting: Family Medicine

## 2018-06-21 ENCOUNTER — Emergency Department (HOSPITAL_COMMUNITY): Payer: BLUE CROSS/BLUE SHIELD

## 2018-06-21 ENCOUNTER — Emergency Department (HOSPITAL_COMMUNITY)
Admission: EM | Admit: 2018-06-21 | Discharge: 2018-06-21 | Disposition: A | Discharge status: Home / Self Care | Payer: BLUE CROSS/BLUE SHIELD | Attending: Emergency Medicine | Admitting: Emergency Medicine

## 2018-06-21 DIAGNOSIS — I5022 Chronic systolic (congestive) heart failure: Secondary | ICD-10-CM | POA: Diagnosis not present

## 2018-06-21 DIAGNOSIS — Z87891 Personal history of nicotine dependence: Secondary | ICD-10-CM | POA: Diagnosis not present

## 2018-06-21 DIAGNOSIS — Z7901 Long term (current) use of anticoagulants: Secondary | ICD-10-CM | POA: Insufficient documentation

## 2018-06-21 DIAGNOSIS — G44319 Acute post-traumatic headache, not intractable: Secondary | ICD-10-CM | POA: Diagnosis not present

## 2018-06-21 DIAGNOSIS — R51 Headache: Secondary | ICD-10-CM | POA: Insufficient documentation

## 2018-06-21 DIAGNOSIS — I110 Hypertensive heart disease with heart failure: Secondary | ICD-10-CM | POA: Diagnosis not present

## 2018-06-21 DIAGNOSIS — M542 Cervicalgia: Secondary | ICD-10-CM | POA: Diagnosis present

## 2018-06-21 DIAGNOSIS — R519 Headache, unspecified: Secondary | ICD-10-CM
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
## 2018-06-21 NOTE — ED Notes (Signed)
Patient verbalizes understanding of discharge instructions . Opportunity for questions and answers were provided . Armband removed by staff ,Pt discharged from ED. W/C  offered at D/C  and Declined W/C at D/C and was escorted to lobby by RN.  
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
## 2019-02-23 ENCOUNTER — Ambulatory Visit: Payer: BLUE CROSS/BLUE SHIELD | Attending: Internal Medicine

## 2019-02-23 DIAGNOSIS — Z20822 Contact with and (suspected) exposure to covid-19: Secondary | ICD-10-CM

## 2019-02-24 LAB — NOVEL CORONAVIRUS, NAA: SARS-CoV-2, NAA: DETECTED — AB

## 2019-04-10 ENCOUNTER — Encounter (HOSPITAL_COMMUNITY): Payer: Self-pay | Admitting: Emergency Medicine

## 2019-04-10 ENCOUNTER — Emergency Department (HOSPITAL_COMMUNITY): Payer: BLUE CROSS/BLUE SHIELD

## 2019-04-10 ENCOUNTER — Emergency Department (HOSPITAL_COMMUNITY)
Admission: EM | Admit: 2019-04-10 | Discharge: 2019-04-10 | Disposition: A | Discharge status: Home / Self Care | Payer: BLUE CROSS/BLUE SHIELD | Attending: Emergency Medicine | Admitting: Emergency Medicine

## 2019-04-10 ENCOUNTER — Other Ambulatory Visit: Payer: Self-pay

## 2019-04-10 DIAGNOSIS — Z79899 Other long term (current) drug therapy: Secondary | ICD-10-CM | POA: Diagnosis not present

## 2019-04-10 DIAGNOSIS — I5042 Chronic combined systolic (congestive) and diastolic (congestive) heart failure: Secondary | ICD-10-CM | POA: Insufficient documentation

## 2019-04-10 DIAGNOSIS — Z87891 Personal history of nicotine dependence: Secondary | ICD-10-CM | POA: Diagnosis not present

## 2019-04-10 DIAGNOSIS — M7918 Myalgia, other site: Secondary | ICD-10-CM | POA: Diagnosis not present

## 2019-04-10 DIAGNOSIS — R519 Headache, unspecified: Secondary | ICD-10-CM | POA: Insufficient documentation

## 2019-04-10 DIAGNOSIS — I110 Hypertensive heart disease with heart failure: Secondary | ICD-10-CM | POA: Diagnosis not present

## 2019-04-10 MED ORDER — ACETAMINOPHEN 500 MG PO TABS: 500.0000 mg | ORAL_TABLET | Freq: Four times a day (QID) | ORAL | 0 refills | Status: AC | PRN

## 2019-04-10 MED ORDER — METHOCARBAMOL 500 MG PO TABS: 500.0000 mg | ORAL_TABLET | Freq: Two times a day (BID) | ORAL | 0 refills | Status: AC

## 2019-04-10 MED ORDER — ACETAMINOPHEN 325 MG PO TABS
650.0000 mg | ORAL_TABLET | Freq: Once | ORAL | Status: AC
  Administered 2019-04-10: 650 mg via ORAL
  Filled 2019-04-10: qty 2
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
## 2019-04-10 NOTE — Discharge Instructions (Signed)
Your images were reassuring today. No signs of breaks or other abnormalities. Please pick up your prescriptions and take as prescribed. Avoid NSAIDs given you are on Eliquis.  Do not drive while on the muscle relaxer as it can make you drowsy.  Follow up with your PCP regarding your ED visit.  Return to the ED for any worsening symptoms including worsening headache, vision changes, confusion, speech difficulties, weakness or numbness on one side of your body, chest pain, abdominal pain, or any other concerning symptoms.

## 2019-04-10 NOTE — ED Notes (Signed)
Pt ambulatory to restroom with steady gait. Pt reports being sore.
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
## 2019-06-22 ENCOUNTER — Other Ambulatory Visit: Payer: Self-pay | Admitting: *Deleted

## 2019-06-22 DIAGNOSIS — Z1231 Encounter for screening mammogram for malignant neoplasm of breast: Secondary | ICD-10-CM

## 2019-06-23 ENCOUNTER — Ambulatory Visit
Admission: RE | Admit: 2019-06-23 | Discharge: 2019-06-23 | Disposition: A | Discharge status: Home / Self Care | Payer: BC Managed Care – PPO | Source: Ambulatory Visit | Attending: *Deleted | Admitting: *Deleted

## 2019-06-23 ENCOUNTER — Other Ambulatory Visit: Payer: Self-pay

## 2019-06-23 DIAGNOSIS — Z1231 Encounter for screening mammogram for malignant neoplasm of breast: Secondary | ICD-10-CM

## 2020-08-28 ENCOUNTER — Other Ambulatory Visit: Payer: Self-pay | Admitting: *Deleted

## 2020-08-28 DIAGNOSIS — Z1231 Encounter for screening mammogram for malignant neoplasm of breast: Secondary | ICD-10-CM

## 2020-08-29 ENCOUNTER — Ambulatory Visit
Admission: RE | Admit: 2020-08-29 | Discharge: 2020-08-29 | Disposition: A | Discharge status: Home / Self Care | Payer: BC Managed Care – PPO | Source: Ambulatory Visit | Attending: *Deleted | Admitting: *Deleted

## 2020-08-29 ENCOUNTER — Other Ambulatory Visit: Payer: Self-pay

## 2020-08-29 DIAGNOSIS — Z1231 Encounter for screening mammogram for malignant neoplasm of breast: Secondary | ICD-10-CM
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
## 2021-12-05 ENCOUNTER — Other Ambulatory Visit: Payer: Self-pay | Admitting: *Deleted

## 2021-12-05 DIAGNOSIS — Z1231 Encounter for screening mammogram for malignant neoplasm of breast: Secondary | ICD-10-CM

## 2022-01-14 ENCOUNTER — Ambulatory Visit: Payer: BC Managed Care – PPO

## 2022-01-20 ENCOUNTER — Ambulatory Visit
Admission: RE | Admit: 2022-01-20 | Discharge: 2022-01-20 | Disposition: A | Discharge status: Home / Self Care | Payer: Commercial Managed Care - HMO | Source: Ambulatory Visit | Attending: *Deleted | Admitting: *Deleted

## 2022-01-20 DIAGNOSIS — Z1231 Encounter for screening mammogram for malignant neoplasm of breast: Secondary | ICD-10-CM

## 2023-01-12 DIAGNOSIS — M25561 Pain in right knee: Secondary | ICD-10-CM | POA: Insufficient documentation

## 2023-01-12 DIAGNOSIS — M545 Low back pain, unspecified: Secondary | ICD-10-CM | POA: Insufficient documentation

## 2023-03-11 ENCOUNTER — Ambulatory Visit: Payer: Commercial Managed Care - HMO | Attending: Internal Medicine | Admitting: Internal Medicine

## 2023-03-11 VITALS — BP 140/78 | HR 73 | Ht 65.0 in | Wt 291.0 lb

## 2023-03-11 DIAGNOSIS — I10 Essential (primary) hypertension: Secondary | ICD-10-CM

## 2023-03-11 DIAGNOSIS — Z87891 Personal history of nicotine dependence: Secondary | ICD-10-CM

## 2023-03-11 DIAGNOSIS — I428 Other cardiomyopathies: Secondary | ICD-10-CM | POA: Diagnosis not present

## 2023-03-11 DIAGNOSIS — I5020 Unspecified systolic (congestive) heart failure: Secondary | ICD-10-CM | POA: Insufficient documentation

## 2023-03-11 DIAGNOSIS — I480 Paroxysmal atrial fibrillation: Secondary | ICD-10-CM

## 2023-03-11 MED ORDER — METOPROLOL SUCCINATE ER 50 MG PO TB24: 50.0000 mg | ORAL_TABLET | Freq: Every day | ORAL | 3 refills | Status: AC

## 2023-03-11 NOTE — Progress Notes (Signed)
 Cardiology Office Note:  .    Date:  03/11/2023  ID:  Wanda Trujillo, DOB 02/02/1961, MRN [id number] PCP: Wanda Glimpse, NP  [facility] Providers Cardiologist:  Wanda Melody, MD     CC: Chest Pain Consulted for the evaluation of re-establish care at the behest of Ms. Wanda Trujillo- NP   History of Present Illness: .    Wanda Trujillo is a 63 y.o. female with former AF associated HF with new atypical CP  Miss Wanda Trujillo, a 63 year old female with a history of non-ischemic cardiomyopathy, paroxysmal atrial fibrillation, former tobacco use, and morbid obesity, presents for re-establishment of cardiac care after a gap since 2017. At her last evaluation, she was in normal rhythm with an ejection fraction of about 30% and was on Eliquis . There was an expectation that her ejection fraction would normalize with weight loss and medication therapy. The patient reports intermittent, slight chest pains that have been occurring over the past couple of weeks and last month. These pains are brief and come and go. She denies any persistent pain or discomfort. She also denies any recent shortness of breath, breathing issues, abnormal heartbeats, syncope, or leg swelling. In the past, during episodes of atrial fibrillation, she experienced shortness of breath. She has a family history of heart disease, with her father having suffered a heart attack in his fifties. Her mother also passed away, but the cause is unclear. The patient admits to a sedentary lifestyle with minimal physical activity. She is currently on metoprolol  tartrate once daily, which may be contributing to her symptoms due to its short half-life.   Relevant histories: .  Social From [place] ROS: As per HPI.   Studies Reviewed: .   Cardiac Studies & Procedures      ECHOCARDIOGRAM  ECHOCARDIOGRAM COMPLETE 12/11/2014  Narrative *[place]* *[facility]* [address] [place], [place]  [address] [phone]  ------------------------------------------------------------------- Transthoracic Echocardiography  Patient:    Wanda Trujillo MR #:       [id number] Study Date: 12/11/2014 Gender:     F Age:        54 Height:     165.1 cm Weight:     116.1 kg BSA:        2.37 m^2 Pt. Status: Room:       3W12C  SONOGRAPHER  Meyer Ada, RDCS ADMITTING    Wanda Gens, MD ATTENDING    Wanda Gens, MD PERFORMING   [facility], Inpatient ORDERING     Ardia Kraft REFERRING    Abagail Hoar R  cc:  ------------------------------------------------------------------- LV EF: 40% -   45%  ------------------------------------------------------------------- Indications:      Atrial fibrillation - 427.31.  ------------------------------------------------------------------- History:   PMH:   Congestive heart failure.  Risk factors: Hypertension.  ------------------------------------------------------------------- Study Conclusions  - Left ventricle: The cavity size was normal. There was mild concentric hypertrophy. Systolic function was mildly to moderately reduced. The estimated ejection fraction was in the range of 40% to 45%. Diffuse hypokinesis. There is akinesis of the basalinferior myocardium. - Aortic valve: Trileaflet; normal thickness, mildly calcified leaflets. - Mitral valve: There was trivial regurgitation.  Transthoracic echocardiography.  M-mode, complete 2D, spectral Doppler, and color Doppler.  Birthdate:  Patient birthdate: [birth year].  Age:  Patient is 63 yr old.  Sex:  Gender: female. BMI: 42.6 kg/m^2.  Blood pressure:     129/64  Patient status: Inpatient.  Study date:  Study date: 12/11/2014. Study time: 12:22 PM.  Location:  Bedside.  -------------------------------------------------------------------  ------------------------------------------------------------------- Left ventricle:  The cavity size was normal. There was  mild concentric hypertrophy. Systolic function was mildly to moderately reduced. The estimated ejection fraction was in the range of 40% to 45%. Diffuse hypokinesis.  Regional wall motion abnormalities: There is akinesis of the basalinferior myocardium.  ------------------------------------------------------------------- Aortic valve:   Trileaflet; normal thickness, mildly calcified leaflets. Mobility was not restricted.  Doppler:  Transvalvular velocity was within the normal range. There was no stenosis. There was no regurgitation.  ------------------------------------------------------------------- Aorta:  Aortic root: The aortic root was normal in size.  ------------------------------------------------------------------- Mitral valve:   Structurally normal valve.   Mobility was not restricted.  Doppler:  Transvalvular velocity was within the normal range. There was no evidence for stenosis. There was trivial regurgitation.    Peak gradient (D): 4 mm Hg.  ------------------------------------------------------------------- Left atrium:  The atrium was normal in size.  ------------------------------------------------------------------- Right ventricle:  The cavity size was normal. Wall thickness was normal. Systolic function was normal.  ------------------------------------------------------------------- Pulmonic valve:    Structurally normal valve.   Cusp separation was normal.  Doppler:  Transvalvular velocity was within the normal range. There was no evidence for stenosis. There was no regurgitation.  ------------------------------------------------------------------- Tricuspid valve:   Structurally normal valve.    Doppler: Transvalvular velocity was within the normal range. There was no regurgitation.  ------------------------------------------------------------------- Pulmonary artery:   The main pulmonary artery was normal-sized. Systolic pressure was within the normal  range.  ------------------------------------------------------------------- Right atrium:  The atrium was normal in size.  ------------------------------------------------------------------- Pericardium:  There was no pericardial effusion.  ------------------------------------------------------------------- Systemic veins: Inferior vena cava: The vessel was normal in size.  ------------------------------------------------------------------- Measurements  Left ventricle                           Value        Reference LV ID, ED, PLAX chordal                  49.1  mm     43 - 52 LV ID, ES, PLAX chordal        (H)       38.9  mm     23 - 38 LV fx shortening, PLAX chordal (L)       21    %      >=29 LV PW thickness, ED                      12.1  mm     --------- IVS/LV PW ratio, ED                      1.04         <=1.3 LV e&', lateral                           7.4   cm/s   --------- LV E/e&', lateral                         12.92        --------- LV e&', medial                            8.16  cm/s   --------- LV E/e&', medial  11.72        --------- LV e&', average                           7.78  cm/s   --------- LV E/e&', average                         12.29        ---------  Ventricular septum                       Value        Reference IVS thickness, ED                        12.6  mm     ---------  LVOT                                     Value        Reference LVOT ID, S                               18    mm     --------- LVOT area                                2.54  cm^2   ---------  Aorta                                    Value        Reference Aortic root ID, ED                       30    mm     ---------  Left atrium                              Value        Reference LA ID, A-P, ES                           46    mm     --------- LA ID/bsa, A-P                           1.94  cm/m^2 <=2.2 LA volume, S                             73.9   ml     --------- LA volume/bsa, S                         31.2  ml/m^2 --------- LA volume, ES, 1-p A4C                   63.6  ml     --------- LA volume/bsa, ES, 1-p A4C               26.8  ml/m^2 --------- LA volume,  ES, 1-p A2C                   80.3  ml     --------- LA volume/bsa, ES, 1-p A2C               33.9  ml/m^2 ---------  Mitral valve                             Value        Reference Mitral E-wave peak velocity              95.6  cm/s   --------- Mitral A-wave peak velocity              45.1  cm/s   --------- Mitral deceleration time                 211   ms     150 - 230 Mitral peak gradient, D                  4     mm Hg  --------- Mitral E/A ratio, peak                   2.1          ---------  Right ventricle                          Value        Reference RV s&', lateral, S                        11.5  cm/s   ---------  Legend: (L)  and  (H)  mark values outside specified reference range.  ------------------------------------------------------------------- Prepared and Electronically Authenticated by  Gaylyn Keas, MD 2016-10-17T15:04:19  TEE  ECHO TEE 06/21/2015  Narrative *[place]* *[facility]* [address] [place], [place] [address] [phone]  ------------------------------------------------------------------- Transesophageal Echocardiography with Cardioversion  Patient:    Teigan, Carver MR #:       [id number] Study Date: 06/21/2015 Gender:     F Age:        54 Height:     162.6 cm Weight:     122.3 kg BSA:        2.42 m^2 Pt. Status: Room:  PERFORMING   Dorothye Gathers, M.D. ADMITTING    Randene Bustard, MD ATTENDING    Randene Bustard, MD Thresea Flor, Brittainy M REFERRING    [place], [place] M SONOGRAPHER  Raynelle Callow  cc:  ------------------------------------------------------------------- LV EF: 30% -   35%  ------------------------------------------------------------------- History:   PMH:  NICM.  Former smoker. Morbid obesity.  Atrial fibrillation.  Congestive heart failure.  Risk factors: Hypertension.  ------------------------------------------------------------------- Study Conclusions  - Left ventricle: The estimated ejection fraction was in the range of 30% to 35%. No evidence of thrombus. - Aortic valve: No evidence of vegetation. - Mitral valve: No evidence of vegetation. There was mild regurgitation. - Left atrium: No evidence of thrombus in the atrial cavity or appendage. No evidence of thrombus in the appendage. There was spontaneous echo contrast (&quot;smoke&quot;). - Right atrium: No evidence of thrombus in the atrial cavity or appendage. - Tricuspid valve: No evidence of vegetation. - Pulmonic valve: No evidence of vegetation. - Superior vena cava: The study excluded a thrombus.  Impressions:  - Successful  cardioversion. No cardiac source of emboli was indentified.  Transesophageal echocardiography with cardioversion.  2D and intravenous contrast injection.  Birthdate:  Patient birthdate: 1960/09/13.  Age:  Patient is 63 yr old.  Sex:  Gender: female. BMI: 46.3 kg/m^2.  Blood pressure:     118/65  Patient status: Inpatient.  Study date:  Study date: 06/21/2015. Study time: 12:43 PM.  Location:  Endoscopy.  -------------------------------------------------------------------  ------------------------------------------------------------------- Left ventricle:  The estimated ejection fraction was in the range of 30% to 35%.  No evidence of thrombus.  ------------------------------------------------------------------- Aortic valve:   Mildly thickened, mildly calcified leaflets. Cusp separation was normal.  No evidence of vegetation.  Doppler:  There was no regurgitation.  ------------------------------------------------------------------- Aorta:  The aorta was normal, not dilated, and  non-diseased.  ------------------------------------------------------------------- Mitral valve:   Structurally normal valve.   Leaflet separation was normal.  No evidence of vegetation.  Doppler:  There was mild regurgitation.  ------------------------------------------------------------------- Left atrium:  The atrium was normal in size.  No evidence of thrombus in the atrial cavity or appendage.  No evidence of thrombus in the appendage. There was spontaneous echo contrast (&quot;smoke&quot;). The appendage was of normal size. Emptying velocity was normal.  ------------------------------------------------------------------- Right ventricle:  The cavity size was normal. Wall thickness was normal. Systolic function was normal.  ------------------------------------------------------------------- Pulmonic valve:    Structurally normal valve.   Cusp separation was normal.  No evidence of vegetation.  ------------------------------------------------------------------- Tricuspid valve:   Structurally normal valve.   Leaflet separation was normal.  No evidence of vegetation.  Doppler:  There was mild regurgitation.  ------------------------------------------------------------------- Right atrium:  The atrium was normal in size.  No evidence of thrombus in the atrial cavity or appendage.  ------------------------------------------------------------------- Pericardium:  The pericardium was normal in appearance. There was no pericardial effusion.  ------------------------------------------------------------------- Systemic veins: Superior vena cava: The study excluded a thrombus.  ------------------------------------------------------------------- Post procedure conclusions Ascending Aorta:  - The aorta was normal, not dilated, and non-diseased.  ------------------------------------------------------------------- Prepared and Electronically Authenticated by  Dorothye Gathers,  M.D. 2017-04-27T13:30:09             Physical Exam:    VS:  BP (!) 140/78 (BP Location: Right Arm)   Pulse 73   Ht 5\' 5"  (1.651 m)   Wt 291 lb (132 kg)   SpO2 95%   BMI 48.42 kg/m    Wt Readings from Last 3 Encounters:  03/11/23 291 lb (132 kg)  04/10/19 270 lb (122.5 kg)  06/21/18 259 lb (117.5 kg)    Gen: no distress, Morbid obesity  Neck: No JVD Cardiac: No Rubs or Gallops, no murmur, RRR +2 radial pulses Respiratory: Clear to auscultation bilaterally, normal effort, normal  respiratory rate GI: Soft, nontender, non-distended  MS: No  edema;  moves all extremities Integument: Skin feels warm Neuro:  At time of evaluation, alert and oriented to person/place/time/situation  Psych: Normal affect, patient feels ok   ASSESSMENT AND PLAN: .    Atrial Fibrillation with Congestive Heart Failure  Paroxysmal atrial fibrillation and non-ischemic cardiomyopathy with an EF of 30% (2017). Currently asymptomatic for atrial fibrillation but experiencing intermittent, atypical chest pain. No dyspnea, edema, or other heart failure symptoms. Chest pain may be due to inconsistent metoprolol  tartrate dosing. Discussed switching to metoprolol  succinate for stable blood levels. Echocardiogram will assess current heart function and determine need for further testing, such as a stress test.  - continue DOAC- CHADVASC 3 - Switch to metoprolol  succinate 50 mg once daily  - Consider  stress test if echocardiogram is normal and symptoms persist   Atypical CP Intermittent, slight chest pain over the past few weeks, not consistent with typical angina. Likely related to medication dosing. Echocardiogram will help rule out other causes.  -Offered stress test - reassess after echo results - Monitor symptoms after switching to metoprolol  succinate   General Health Maintenance  Postmenopausal with a strong family history of coronary artery disease. Discussed increased heart disease risk post-menopause  and importance of lifestyle modifications and risk factor management.  - Discuss lifestyle modifications and risk factor management at follow-up given morbid obesity   Gloriann Larger, MD FASE [facility] Cardiologist [facility]  [address] [place], [address] [place], [place] [address] [phone]  3:47 PM

## 2023-03-11 NOTE — Patient Instructions (Signed)
 Medication Instructions:  Your physician has recommended you make the following change in your medication:  STOP: metoprolol  tartrate START: metoprolol  succinate (Toprol  XL) 50 mg by mouth once daily  *If you need a refill on your cardiac medications before your next appointment, please call your pharmacy*   Lab Work: NONE If you have labs (blood work) drawn today and your tests are completely normal, you will receive your results only by: MyChart Message (if you have MyChart) OR A paper copy in the mail If you have any lab test that is abnormal or we need to change your treatment, we will call you to review the results.   Testing/Procedures: Your physician has requested that you have an echocardiogram. Echocardiography is a painless test that uses sound waves to create images of your heart. It provides your doctor with information about the size and shape of your heart and how well your heart's chambers and valves are working. This procedure takes approximately one hour. There are no restrictions for this procedure. Please do NOT wear cologne, perfume, aftershave, or lotions (deodorant is allowed). Please arrive 15 minutes prior to your appointment time.  Please note: We ask at that you not bring children with you during ultrasound (echo/ vascular) testing. Due to room size and safety concerns, children are not allowed in the ultrasound rooms during exams. Our front office staff cannot provide observation of children in our lobby area while testing is being conducted. An adult accompanying a patient to their appointment will only be allowed in the ultrasound room at the discretion of the ultrasound technician under special circumstances. We apologize for any inconvenience.    Follow-Up: At [facility], you and your health needs are our priority.  As part of our continuing mission to provide you with exceptional heart care, we have created designated Provider Care Teams.  These  Care Teams include your primary Cardiologist (physician) and Advanced Practice Providers (APPs -  Physician Assistants and Nurse Practitioners) who all work together to provide you with the care you need, when you need it.  We recommend signing up for the patient portal called "MyChart".  Sign up information is provided on this After Visit Summary.  MyChart is used to connect with patients for Virtual Visits (Telemedicine).  Patients are able to view lab/test results, encounter notes, upcoming appointments, etc.  Non-urgent messages can be sent to your provider as well.   To learn more about what you can do with MyChart, go to [email or web address].    Your next appointment:   1 year(s)  Provider:   Jann Melody, MD

## 2023-03-12 ENCOUNTER — Ambulatory Visit (HOSPITAL_COMMUNITY): Payer: Commercial Managed Care - HMO | Attending: Internal Medicine

## 2023-03-12 DIAGNOSIS — I5020 Unspecified systolic (congestive) heart failure: Secondary | ICD-10-CM | POA: Insufficient documentation

## 2023-03-12 DIAGNOSIS — I480 Paroxysmal atrial fibrillation: Secondary | ICD-10-CM | POA: Diagnosis present

## 2023-03-12 LAB — ECHOCARDIOGRAM COMPLETE
Area-P 1/2: 3.54 cm2
S' Lateral: 3.65 cm

## 2023-03-25 ENCOUNTER — Telehealth: Payer: Self-pay

## 2023-04-08 NOTE — Telephone Encounter (Signed)
Will close this encounter.

## 2023-06-23 ENCOUNTER — Other Ambulatory Visit: Payer: Self-pay | Admitting: *Deleted

## 2023-06-23 DIAGNOSIS — Z1231 Encounter for screening mammogram for malignant neoplasm of breast: Secondary | ICD-10-CM

## 2023-08-11 ENCOUNTER — Telehealth: Payer: Self-pay

## 2023-08-11 NOTE — Telephone Encounter (Signed)
 Please advise holding Eliquis  prior to colonoscopy on 08/26/2023.  Thank you!  DW

## 2023-08-11 NOTE — Telephone Encounter (Signed)
   Pre-operative Risk Assessment    Patient Name: Wanda Trujillo  DOB: 01/12/1961 MRN: [id number]   Date of last office visit: 03/11/23 [facility], MD Date of next office visit: NONE   Request for Surgical Clearance    Procedure:  COLONOSCOPY  Date of Surgery:  Clearance 08/26/23                                Surgeon:  DR Felecia Hopper Surgeon's Group or Practice Name:  [facility] Phone number:  [phone] Fax number:  [phone]   Type of Clearance Requested:   - Medical  - Pharmacy:  Hold Apixaban  (Eliquis )     Type of Anesthesia:  PROPOFOL    Additional requests/questions:    SignedCollin Deal   08/11/2023, 4:30 PM

## 2023-08-12 NOTE — Telephone Encounter (Signed)
 Left message to call back to schedule tele pre op appt.

## 2023-08-12 NOTE — Telephone Encounter (Signed)
 Patient with diagnosis of PAF on Eliquis  for anticoagulation.    Procedure: colonoscopy Date of procedure: 08/26/23   CHA2DS2-VASc Score = 3  This indicates a 3.2% annual risk of stroke. The patient's score is based upon: CHF History: 1 HTN History: 1 Diabetes History: 0 Stroke History: 0 Vascular Disease History: 0 Age Score: 0 Gender Score: 1     CrCl 103 ml/min using adj body weight Platelet count 137K.  Per office protocol, patient can hold Eliquis  for 2 days prior to procedure.   .  **This guidance is not considered finalized until pre-operative APP has relayed final recommendations.**

## 2023-08-12 NOTE — Telephone Encounter (Signed)
   Name: Wanda Trujillo  DOB: 1960/08/19  MRN: [id number]  Primary Cardiologist: Jann Melody, MD   Preoperative team, please contact this patient and set up a phone call appointment for further preoperative risk assessment. Please obtain consent and complete medication review. Thank you for your help.  I confirm that guidance regarding antiplatelet and oral anticoagulation therapy has been completed and, if necessary, noted below.  I also confirmed the patient resides in the state of [place] . As per [facility] Medical Board telemedicine laws, the patient must reside in the state in which the provider is licensed.   Lamond Pilot, PA 08/12/2023, 3:08 PM [facility]

## 2023-08-13 NOTE — Telephone Encounter (Signed)
 2nd attempt contacting patient to schedule tele preop appt no answer left a detailed vm to call back and schedule

## 2023-08-14 NOTE — Telephone Encounter (Signed)
 3rd attempt to reach pt to schedule tele preop appt. I will update the surgeon office the pt needs to call our office to schedule appt [phone].

## 2023-08-20 ENCOUNTER — Ambulatory Visit
Admission: RE | Admit: 2023-08-20 | Discharge: 2023-08-20 | Disposition: A | Discharge status: Home / Self Care | Source: Ambulatory Visit | Attending: *Deleted | Admitting: *Deleted

## 2023-08-20 DIAGNOSIS — Z1231 Encounter for screening mammogram for malignant neoplasm of breast: Secondary | ICD-10-CM

## 2023-08-26 ENCOUNTER — Telehealth: Payer: Self-pay

## 2023-08-26 NOTE — Telephone Encounter (Signed)
 Med Rec and Consent done    Patient Consent for Virtual Visit        Wanda Trujillo has provided verbal consent on 08/26/2023 for a virtual visit (video or telephone).   CONSENT FOR VIRTUAL VISIT FOR:  Wanda Trujillo  By participating in this virtual visit I agree to the following:  I hereby voluntarily request, consent and authorize [facility] and its employed or contracted physicians, physician assistants, nurse practitioners or other licensed health care professionals (the Practitioner), to provide me with telemedicine health care services (the "Services) as deemed necessary by the treating Practitioner. I acknowledge and consent to receive the Services by the Practitioner via telemedicine. I understand that the telemedicine visit will involve communicating with the Practitioner through live audiovisual communication technology and the disclosure of certain medical information by electronic transmission. I acknowledge that I have been given the opportunity to request an in-person assessment or other available alternative prior to the telemedicine visit and am voluntarily participating in the telemedicine visit.  I understand that I have the right to withhold or withdraw my consent to the use of telemedicine in the course of my care at any time, without affecting my right to future care or treatment, and that the Practitioner or I may terminate the telemedicine visit at any time. I understand that I have the right to inspect all information obtained and/or recorded in the course of the telemedicine visit and may receive copies of available information for a reasonable fee.  I understand that some of the potential risks of receiving the Services via telemedicine include:  Delay or interruption in medical evaluation due to technological equipment failure or disruption; Information transmitted may not be sufficient (e.g. poor resolution of images) to allow for appropriate medical  decision making by the Practitioner; and/or  In rare instances, security protocols could fail, causing a breach of personal health information.  Furthermore, I acknowledge that it is my responsibility to provide information about my medical history, conditions and care that is complete and accurate to the best of my ability. I acknowledge that Practitioner's advice, recommendations, and/or decision may be based on factors not within their control, such as incomplete or inaccurate data provided by me or distortions of diagnostic images or specimens that may result from electronic transmissions. I understand that the practice of medicine is not an exact science and that Practitioner makes no warranties or guarantees regarding treatment outcomes. I acknowledge that a copy of this consent can be made available to me via my patient portal [facility] MyChart), or I can request a printed copy by calling the office of [facility].    I understand that my insurance will be billed for this visit.   I have read or had this consent read to me. I understand the contents of this consent, which adequately explains the benefits and risks of the Services being provided via telemedicine.  I have been provided ample opportunity to ask questions regarding this consent and the Services and have had my questions answered to my satisfaction. I give my informed consent for the services to be provided through the use of telemedicine in my medical care

## 2023-08-26 NOTE — Telephone Encounter (Signed)
 Pt called in and scheduled TELE Preop appt 09/08/23.  Med Rec and Consent done

## 2023-09-08 ENCOUNTER — Ambulatory Visit: Attending: Cardiology

## 2023-09-08 DIAGNOSIS — Z01810 Encounter for preprocedural cardiovascular examination: Secondary | ICD-10-CM | POA: Diagnosis not present

## 2023-09-08 NOTE — Progress Notes (Signed)
 Virtual Visit via Telephone Note   Because of [name] co-morbid illnesses, she is at least at moderate risk for complications without adequate follow up.  This format is felt to be most appropriate for this patient at this time.  Due to technical limitations with video connection (technology), today's appointment will be conducted as an audio only telehealth visit, and [name] verbally agreed to proceed in this manner.   All issues noted in this document were discussed and addressed.  No physical exam could be performed with this format.  Evaluation Performed:  Preoperative cardiovascular risk assessment _____________   Date:  09/08/2023   Patient ID:  Wanda Trujillo, DOB Jul 23, 1960, MRN [id number] Patient Location:  Home Provider location:   Office  Primary Care Provider:  Cristopher Suzen CHRISTELLA, NP Primary Cardiologist:  Stanly DELENA Leavens, MD  Chief Complaint / Patient Profile  63 y.o. y/o female with a h/o atrial fibrillation with congestive heart failure (nonischemic cardiomyopathy) who is pending colonoscopy and presents today for telephonic preoperative cardiovascular risk assessment. History of Present Illness  Wanda Trujillo is a 63 y.o. female who presents via audio/video conferencing for a telehealth visit today.  Pt was last seen in cardiology clinic on 03/11/2023 by Dr. Leavens.  At that time Wanda Trujillo was doing well, she noted atypical chest pain that was intermittent and slight discomfort, not consistent with typical angina felt to be related to medication dosing.  Her metoprolol  tartrate was changed to metoprolol  succinate for stable blood levels.  Echocardiogram on 03/12/2023 indicated LVEF of 50 to 55%, no RWMA, moderate asymmetric LVH, the patient is now pending procedure as outlined above. Since her last visit, she has remained stable from a cardiac standpoint. She denies any further chest discomfort, reports this resolved  with her medications changes. Today she denies chest pain, shortness of breath, lower extremity edema, fatigue, palpitations, melena, hematuria, hemoptysis, diaphoresis, weakness, presyncope, syncope, orthopnea, and PND. She is able to achieve greater than 4 METs of activity.  Past Medical History    Past Medical History:  Diagnosis Date   Anemia    a. Noted on labs 11/2014, no prior to compare to.   Chronic systolic CHF (congestive heart failure) (HCC)    Essential hypertension    Former tobacco use    Morbid obesity (HCC)    NICM (nonischemic cardiomyopathy) (HCC)    a. Dx in 2010 in [place] - no prior cath; patient reports prior negative stress tests. b. 2D ECHO with EF 40-45%, mild concentric hypertrophy. Diffuse hypokinesis. There is akinesis of the basal inferior myocardium.   PAF (paroxysmal atrial fibrillation) (HCC)    a. new dx on 11/2014 admission. s/p TEE/DCCV. placed on Eliquis  b. TEE/DCCV 06/21/2015   Shortness of breath dyspnea    Past Surgical History:  Procedure Laterality Date   CARDIOVERSION N/A 12/11/2014   Procedure: CARDIOVERSION;  Surgeon: Annabella Scarce, MD;  Location[facility] ENDOSCOPY;  Service: Cardiovascular;  Laterality: N/A;   CARDIOVERSION N/A 06/21/2015   Procedure: CARDIOVERSION;  Surgeon: Oneil JAYSON Parchment, MD;  Location: [facility] ENDOSCOPY;  Service: Cardiovascular;  Laterality: N/A;   LAPAROSCOPIC GASTRIC SLEEVE RESECTION N/A    2012   TEE WITHOUT CARDIOVERSION N/A 12/11/2014   Procedure: TRANSESOPHAGEAL ECHOCARDIOGRAM (TEE);  Surgeon: Annabella Scarce, MD;  Location: [facility] ENDOSCOPY;  Service: Cardiovascular;  Laterality: N/A;   TEE WITHOUT CARDIOVERSION N/A 06/21/2015   Procedure: TRANSESOPHAGEAL ECHOCARDIOGRAM (TEE);  Surgeon: Oneil JAYSON Parchment, MD;  Location: [facility] ENDOSCOPY;  Service: Cardiovascular;  Laterality: N/A;   Allergies No Known Allergies Home Medications    Prior to Admission medications   Medication Sig Start Date End Date Taking? Authorizing Provider   acetaminophen  (TYLENOL ) 500 MG tablet Take 1 tablet (500 mg total) by mouth every 6 (six) hours as needed. 04/10/19   Venter, Margaux, PA-C  apixaban  (ELIQUIS ) 5 MG TABS tablet Take 1 tablet (5 mg total) by mouth 2 (two) times daily. Patient taking differently: Take 5 mg by mouth daily at 6 (six) AM. 06/06/16   Newlin, Enobong, MD  atorvastatin (LIPITOR) 10 MG tablet daily.    [provider]  lidocaine  (LIDODERM ) 5 % as needed (pain).    [provider]  lisinopril -hydrochlorothiazide (ZESTORETIC) 20-12.5 MG tablet Take 1 tablet by mouth daily. 10/22/22   [provider]  methocarbamol  (ROBAXIN ) 500 MG tablet Take 1 tablet (500 mg total) by mouth 2 (two) times daily. 04/10/19   Shepard, Margaux, PA-C  metoprolol  succinate (TOPROL  XL) 50 MG 24 hr tablet Take 1 tablet (50 mg total) by mouth daily. Take with or immediately following a meal. 03/11/23   Santo Stanly LABOR, MD    Physical Exam  Vital Signs:  BERLIN VIERECK does not have vital signs available for review today. Given telephonic nature of communication, physical exam is limited. AAOx3. NAD. Normal affect.  Speech and respirations are unlabored. Accessory Clinical Findings  None Assessment & Plan   1.  Preoperative Cardiovascular Risk Assessment: Wanda Trujillo's perioperative risk of a major cardiac event is 0.9% according to the Revised Cardiac Risk Index (RCRI).  Therefore, she is at low risk for perioperative complications.   Her functional capacity is good at 7.68 METs according to the Duke Activity Status Index (DASI). Recommendations: According to ACC/AHA guidelines, no further cardiovascular testing needed.  The patient may proceed to surgery at acceptable risk.   Antiplatelet and/or Anticoagulation Recommendations: Eliquis  (Apixaban ) can be held for 2 days prior to surgery.  Please resume post op when felt to be safe.     The patient was advised that if she develops new symptoms prior to  surgery to contact our office to arrange for a follow-up visit, and she verbalized understanding.  A copy of this note will be routed to requesting surgeon.  Time:   Today, I have spent 11 minutes with the patient with telehealth technology discussing medical history, symptoms, and management plan.    Daxx Tiggs D Danya Spearman, NP  09/08/2023, 9:12 AM

## 2023-12-04 ENCOUNTER — Other Ambulatory Visit: Payer: Self-pay

## 2023-12-04 ENCOUNTER — Encounter (HOSPITAL_COMMUNITY): Payer: Self-pay

## 2023-12-04 ENCOUNTER — Emergency Department (HOSPITAL_COMMUNITY): Admission: EM | Admit: 2023-12-04 | Discharge: 2023-12-04 | Disposition: A | Discharge status: Home / Self Care

## 2023-12-04 ENCOUNTER — Emergency Department (HOSPITAL_COMMUNITY)

## 2023-12-04 DIAGNOSIS — Z7901 Long term (current) use of anticoagulants: Secondary | ICD-10-CM | POA: Diagnosis not present

## 2023-12-04 DIAGNOSIS — R7989 Other specified abnormal findings of blood chemistry: Secondary | ICD-10-CM | POA: Diagnosis not present

## 2023-12-04 DIAGNOSIS — I4891 Unspecified atrial fibrillation: Secondary | ICD-10-CM

## 2023-12-04 DIAGNOSIS — R002 Palpitations: Secondary | ICD-10-CM | POA: Insufficient documentation

## 2023-12-04 LAB — BASIC METABOLIC PANEL WITH GFR
Anion gap: 10 (ref 5–15)
BUN: 15 mg/dL (ref 8–23)
CO2: 23 mmol/L (ref 22–32)
Calcium: 8.7 mg/dL — ABNORMAL LOW (ref 8.9–10.3)
Chloride: 106 mmol/L (ref 98–111)
Creatinine, Ser: 0.86 mg/dL (ref 0.44–1.00)
GFR, Estimated: >60 mL/min (ref >60)
Glucose, Bld: 107 mg/dL — ABNORMAL HIGH (ref 70–99)
Potassium: 3.7 mmol/L (ref 3.5–5.1)
Sodium: 139 mmol/L (ref 135–145)

## 2023-12-04 LAB — CBC
HCT: 41.2 % (ref 36.0–46.0)
Hemoglobin: 12.6 g/dL (ref 12.0–15.0)
MCH: 26.1 pg (ref 26.0–34.0)
MCHC: 30.6 g/dL (ref 30.0–36.0)
MCV: 85.5 fL (ref 80.0–100.0)
Platelets: 139 K/uL — ABNORMAL LOW (ref 150–400)
RBC: 4.82 MIL/uL (ref 3.87–5.11)
RDW: 14.4 % (ref 11.5–15.5)
WBC: 5.8 K/uL (ref 4.0–10.5)
nRBC: 0 % (ref 0.0–0.2)

## 2023-12-04 LAB — BRAIN NATRIURETIC PEPTIDE: B Natriuretic Peptide: 391 pg/mL — ABNORMAL HIGH (ref 0.0–100.0)

## 2023-12-04 LAB — TROPONIN I (HIGH SENSITIVITY)
Troponin I (High Sensitivity): 11 ng/L (ref <18)
Troponin I (High Sensitivity): 11 ng/L (ref <18)

## 2023-12-04 MED ORDER — ETOMIDATE 2 MG/ML IV SOLN
12.0000 mg | Freq: Once | INTRAVENOUS | Status: AC
  Administered 2023-12-04: 12 mg via INTRAVENOUS

## 2023-12-04 MED ORDER — METOPROLOL TARTRATE 5 MG/5ML IV SOLN
5.0000 mg | Freq: Once | INTRAVENOUS | Status: AC
  Administered 2023-12-04: 5 mg via INTRAVENOUS
  Filled 2023-12-04: qty 5

## 2023-12-04 MED ORDER — LACTATED RINGERS IV BOLUS
500.0000 mL | Freq: Once | INTRAVENOUS | Status: AC
  Administered 2023-12-04: 500 mL via INTRAVENOUS

## 2023-12-04 MED ORDER — METOPROLOL TARTRATE 25 MG PO TABS
25.0000 mg | ORAL_TABLET | Freq: Once | ORAL | Status: AC
Start: 2023-12-04 — End: 2023-12-04
  Administered 2023-12-04: 25 mg via ORAL
  Filled 2023-12-04: qty 1

## 2023-12-04 NOTE — ED Triage Notes (Signed)
 Pt c/o cp and sob since last night; hx afib; denies dizziness; denies pain , endorses palpitations  Pt gives verbal consent for mse

## 2023-12-04 NOTE — ED Provider Notes (Signed)
 Physical Exam  BP (!) 144/83   Pulse 65   Temp (!) 97.2 F (36.2 C) (Oral)   Resp 14   SpO2 97%   Physical Exam Vitals and nursing note reviewed.  HENT:     Head: Normocephalic and atraumatic.  Eyes:     Pupils: Pupils are equal, round, and reactive to light.  Cardiovascular:     Rate and Rhythm: Normal rate and regular rhythm.  Pulmonary:     Effort: Pulmonary effort is normal.     Breath sounds: Normal breath sounds.  Abdominal:     Palpations: Abdomen is soft.     Tenderness: There is no abdominal tenderness.  Skin:    General: Skin is warm and dry.  Neurological:     Mental Status: She is alert.  Psychiatric:        Mood and Affect: Mood normal.     Procedures  .Critical Care  Performed by: Pamella Ozell LABOR, DO Authorized by: Pamella Ozell LABOR, DO   Critical care provider statement:    Critical care time (minutes):  40   Critical care was necessary to treat or prevent imminent or life-threatening deterioration of the following conditions:  Cardiac failure   Critical care was time spent personally by me on the following activities:  Development of treatment plan with patient or surrogate, discussions with consultants, evaluation of patient's response to treatment, examination of patient, ordering and review of laboratory studies, ordering and review of radiographic studies, ordering and performing treatments and interventions, pulse oximetry, re-evaluation of patient's condition, review of old charts and interpretation of cardiac output measurements   I assumed direction of critical care for this patient from another provider in my specialty: no   .Cardioversion  Date/Time: 12/04/2023 4:30 PM  Performed by: Pamella Ozell LABOR, DO Authorized by: Pamella Ozell LABOR, DO   Consent:    Consent obtained:  Verbal and written   Consent given by:  Patient   Risks discussed:  Death, induced arrhythmia and cutaneous burn   Alternatives discussed:  No treatment and rate-control  medication Universal protocol:    Immediately prior to procedure a time out was called: yes     Patient identity confirmed:  Verbally with patient and hospital-assigned identification number Pre-procedure details:    Cardioversion basis:  Emergent   Rhythm:  Atrial fibrillation   Electrode placement:  Anterior-posterior Patient sedated: Yes. Refer to sedation procedure documentation for details of sedation.  Attempt one:    Cardioversion mode:  Synchronous   Waveform:  Biphasic   Shock (Joules):  150   Shock outcome:  Conversion to normal sinus rhythm Post-procedure details:    Patient status:  Awake   Patient tolerance of procedure:  Tolerated well, no immediate complications .Sedation  Date/Time: 12/04/2023 4:30 PM  Performed by: Pamella Ozell LABOR, DO Authorized by: Pamella Ozell LABOR, DO   Consent:    Consent obtained:  Verbal   Consent given by:  Patient   Risks discussed:  Dysrhythmia, prolonged hypoxia resulting in organ damage, prolonged sedation necessitating reversal, vomiting and nausea   Alternatives discussed:  Analgesia without sedation Universal protocol:    Immediately prior to procedure, a time out was called: yes     Patient identity confirmed:  Anonymous protocol, patient vented/unresponsive and provided demographic data Indications:    Procedure performed:  Cardioversion   Procedure necessitating sedation performed by:  Physician performing sedation Pre-sedation assessment:    Time since last food or drink:  1200  ASA classification: class 2 - patient with mild systemic disease     Mallampati score:  I - soft palate, uvula, fauces, pillars visible   Neck mobility: normal     Pre-sedation assessments completed and reviewed: airway patency, cardiovascular function, hydration status, mental status and respiratory function   A pre-sedation assessment was completed prior to the start of the procedure Immediate pre-procedure details:    Reviewed: vital signs      Verified: bag valve mask available and oxygen available   Procedure details (see MAR for exact dosages):    Sedation:  Etomidate   Intended level of sedation: deep   Analgesia:  None   Intra-procedure monitoring:  Blood pressure monitoring and cardiac monitor   Intra-procedure events: none     Total Provider sedation time (minutes):  10 Post-procedure details:   A post-sedation assessment was completed following the completion of the procedure.   Attendance: Constant attendance by certified staff until patient recovered     Recovery: Patient returned to pre-procedure baseline     Post-sedation assessments completed and reviewed: airway patency, cardiovascular function and mental status     Patient is stable for discharge or admission: yes     Procedure completion:  Tolerated well, no immediate complications   ED Course / MDM   Clinical Course as of 12/04/23 1929  Fri Dec 04, 2023  1213 Saw cardiology in January of this year per my chart review: Atrial Fibrillation with Congestive Heart Failure  Paroxysmal atrial fibrillation and non-ischemic cardiomyopathy with an EF of 30% (2017). Currently asymptomatic for atrial fibrillation but experiencing intermittent, atypical chest pain. No dyspnea, edema, or other heart failure symptoms. Chest pain may be due to inconsistent metoprolol  tartrate dosing. Discussed switching to metoprolol  succinate for stable blood levels. Echocardiogram will assess current heart function and determine need for further testing, such as a stress test.  - continue DOAC- CHADVASC 3 - Switch to metoprolol  succinate 50 mg once daily  - Consider stress test if echocardiogram is normal and symptoms persist    Atypical CP Intermittent, slight chest pain over the past few weeks, not consistent with typical angina. Likely related to medication dosing. Echocardiogram will help rule out other causes.  -Offered stress test - reassess after echo results - Monitor symptoms  after switching to metoprolol  succinate  [TY]  1213 Echo in January per my review: MPRESSIONS     1. Left ventricular ejection fraction, by estimation, is 50 to 55%. The  left ventricle has low normal function. The left ventricle has no regional  wall motion abnormalities. There is moderate asymmetric left ventricular  hypertrophy of the septal segment   (13 mm). Left ventricular diastolic parameters were grossly normal.   2. Right ventricular systolic function is normal. The right ventricular  size is normal. There is normal pulmonary artery systolic pressure. The  estimated right ventricular systolic pressure is 31.3 mmHg.   3. The mitral valve is normal in structure. Trivial mitral valve  regurgitation. No evidence of mitral stenosis.   4. The aortic valve is tricuspid. Aortic valve regurgitation is not  visualized. No aortic stenosis is present.   5. The inferior vena cava is normal in size with greater than 50%  respiratory variability, suggesting right atrial pressure of 3 mmHg.   [TY]  1244 Appears to be A-fib RVR on the monitor in the room on my independent interpretation.  Heart rate in the 110s to the 140s.  Largely asymptomatic, but is feeling some palpitations. [  TY]  1244 CBC(!) No leukocytosis to suggest systemic infection.  No anemia [TY]  1313 DG Chest 2 View IMPRESSION: Findings suggestive of mild central pulmonary vascular congestion.   Electronically Signed   By: Suzen Dials M.D.   On: 12/04/2023 12:48   [TY]  1313 CBC(!) No leukocytosis to suggest systemic infection [TY]  1313 Basic metabolic panel(!) No metabolic derangements.  Normal kidney function [TY]  1314 Troponin I (High Sensitivity): 11 ACS less likely given negative troponin [TY]  1400 Heart rate in the 80s to 90s after metoprolol .  Patient reports improvement in symptoms.  Will attempt to ambulate.  I did discuss cardioversion, patient would like to avoid if possible. [TY]  1451 Troponin I  (High Sensitivity): 11 Repeat troponin negative.  [TY]  1518 Patient attempted to ambulate, however heart rate up into the 140s, started having symptoms again with some chest tightness.  No shortness of breath or desaturation per report.  Heart rate currently in the 110s.  Discussed cardioversion again, but she would like to try further rate control medications. [TY]  1521 Care signed out to afternoon team. Dispo pending repeat metoprolol /rate control.  [TY]    Clinical Course User Index [TY] Neysa Caron PARAS, DO   Medical Decision Making I, Ozell Marine DO, have assumed care of this patient from the previous provider pending reevaluation of A-fib with RVR after second dose of metoprolol  and disposition  Amount and/or Complexity of Data Reviewed Labs: ordered. Decision-making details documented in ED Course. Radiology: ordered. Decision-making details documented in ED Course.  Risk Prescription drug management.          Marine Ozell LABOR, DO 12/04/23 1929

## 2023-12-04 NOTE — Discharge Instructions (Addendum)
 You were seen in the Emergency Department for atrial fibrillation We tried 2 doses of a medication called metoprolol  and that did not improve your heart rate We were able to shock your heart back into a regular rhythm We observed you for a couple hours after and your heart remained in a regular rhythm and you were feeling much better You to follow-up with Dr.Chandrasekhar your cardiologist and let them know you were seen in the emergency department for your A-fib and required a shock Return to the emergency room for chest pain trouble breathing or recurrent A-fib Continue taking all medications including Eliquis 

## 2023-12-04 NOTE — ED Provider Notes (Signed)
 [place] EMERGENCY DEPARTMENT AT [facility] Provider Note   CSN: [id number] Arrival date & time: 12/04/23  1057     Patient presents with: No chief complaint on file.   Wanda Trujillo is a 63 y.o. female.   This is a 63 year old female presenting emergency department for palpitations and chest discomfort.  Reports symptoms started last night.  History of A-fib.  Feels like last time she was in A-fib.  She does take metoprolol  and Eliquis .  Has not missed any doses.  No lightheadedness or dizziness.  She does note some minor shortness of breath.  No recent infectious symptoms.  Has been eating and drinking normal.  No dyspnea on exertion/orthopnea or lower extremity edema.  No risk factors for PE.        Prior to Admission medications   Medication Sig Start Date End Date Taking? Authorizing Provider  acetaminophen  (TYLENOL ) 500 MG tablet Take 1 tablet (500 mg total) by mouth every 6 (six) hours as needed. 04/10/19   Shepard, Margaux, PA-C  apixaban  (ELIQUIS ) 5 MG TABS tablet Take 1 tablet (5 mg total) by mouth 2 (two) times daily. Patient taking differently: Take 5 mg by mouth daily at 6 (six) AM. 06/06/16   Newlin, Enobong, MD  atorvastatin (LIPITOR) 10 MG tablet daily.    [provider]  lidocaine  (LIDODERM ) 5 % as needed (pain).    [provider]  lisinopril -hydrochlorothiazide (ZESTORETIC) 20-12.5 MG tablet Take 1 tablet by mouth daily. 10/22/22   [provider]  methocarbamol  (ROBAXIN ) 500 MG tablet Take 1 tablet (500 mg total) by mouth 2 (two) times daily. 04/10/19   Shepard, Margaux, PA-C  metoprolol  succinate (TOPROL  XL) 50 MG 24 hr tablet Take 1 tablet (50 mg total) by mouth daily. Take with or immediately following a meal. 03/11/23   Chandrasekhar, Stanly LABOR, MD    Allergies: Patient has no known allergies.    Review of Systems  Updated Vital Signs BP (!) 140/87   Pulse (!) 111   Temp 98 F (36.7 C) (Oral)   Resp 18   SpO2  96%   Physical Exam Vitals and nursing note reviewed.  Constitutional:      General: She is not in acute distress.    Appearance: She is obese. She is not toxic-appearing.  HENT:     Head: Normocephalic and atraumatic.     Nose: Nose normal.     Mouth/Throat:     Mouth: Mucous membranes are moist.  Eyes:     Extraocular Movements: Extraocular movements intact.     Conjunctiva/sclera: Conjunctivae normal.  Cardiovascular:     Rate and Rhythm: Tachycardia present. Rhythm irregular.     Pulses: Normal pulses.  Pulmonary:     Effort: Pulmonary effort is normal.     Breath sounds: Normal breath sounds.  Abdominal:     General: Abdomen is flat. There is no distension.     Palpations: Abdomen is soft.     Tenderness: There is no abdominal tenderness. There is no guarding or rebound.  Musculoskeletal:     Right lower leg: No edema.     Left lower leg: No edema.  Skin:    General: Skin is warm.     Capillary Refill: Capillary refill takes less than 2 seconds.  Neurological:     Mental Status: She is alert and oriented to person, place, and time.  Psychiatric:        Mood and Affect: Mood normal.  Behavior: Behavior normal.     (all labs ordered are listed, but only abnormal results are displayed) Labs Reviewed  BASIC METABOLIC PANEL WITH GFR - Abnormal; Notable for the following components:      Result Value   Glucose, Bld 107 (*)    Calcium 8.7 (*)    All other components within normal limits  CBC - Abnormal; Notable for the following components:   Platelets 139 (*)    All other components within normal limits  BRAIN NATRIURETIC PEPTIDE - Abnormal; Notable for the following components:   B Natriuretic Peptide 391.0 (*)    All other components within normal limits  TROPONIN I (HIGH SENSITIVITY)  TROPONIN I (HIGH SENSITIVITY)    EKG: EKG Interpretation Date/Time:  Friday December 04 2023 11:11:33 EDT Ventricular Rate:  124 PR Interval:    QRS Duration:  84 QT  Interval:  316 QTC Calculation: 453 R Axis:   28  Text Interpretation: Atrial fibrillation with rapid ventricular response Nonspecific ST and T wave abnormality Abnormal ECG When compared with ECG of 11-Mar-2023 15:14, PREVIOUS ECG IS PRESENT Confirmed by Neysa Clap [phone]) on 12/04/2023 12:12:03 PM  Radiology: DG Chest 2 View Result Date: 12/04/2023 CLINICAL DATA:  Chest pain and shortness of breath. EXAM: CHEST - 2 VIEW COMPARISON:  June 18, 2015 FINDINGS: The heart size and mediastinal contours are within normal limits. There is mild prominence of the central pulmonary vasculature. Very mild linear atelectasis is noted within the left lung base. No focal consolidation, pleural effusion or pneumothorax is identified. The visualized skeletal structures are unremarkable. IMPRESSION: Findings suggestive of mild central pulmonary vascular congestion. Electronically Signed   By: Suzen Dials M.D.   On: 12/04/2023 12:48     Procedures   Medications Ordered in the ED  metoprolol  tartrate (LOPRESSOR ) injection 5 mg (has no administration in time range)  lactated ringers  bolus 500 mL (0 mLs Intravenous Stopped 12/04/23 1335)  metoprolol  tartrate (LOPRESSOR ) injection 5 mg (5 mg Intravenous Given 12/04/23 1251)  metoprolol  tartrate (LOPRESSOR ) tablet 25 mg (25 mg Oral Given 12/04/23 1336)    Clinical Course as of 12/04/23 1522  Fri Dec 04, 2023  1213 Saw cardiology in January of this year per my chart review: Atrial Fibrillation with Congestive Heart Failure  Paroxysmal atrial fibrillation and non-ischemic cardiomyopathy with an EF of 30% (2017). Currently asymptomatic for atrial fibrillation but experiencing intermittent, atypical chest pain. No dyspnea, edema, or other heart failure symptoms. Chest pain may be due to inconsistent metoprolol  tartrate dosing. Discussed switching to metoprolol  succinate for stable blood levels. Echocardiogram will assess current heart function and determine  need for further testing, such as a stress test.  - continue DOAC- CHADVASC 3 - Switch to metoprolol  succinate 50 mg once daily  - Consider stress test if echocardiogram is normal and symptoms persist    Atypical CP Intermittent, slight chest pain over the past few weeks, not consistent with typical angina. Likely related to medication dosing. Echocardiogram will help rule out other causes.  -Offered stress test - reassess after echo results - Monitor symptoms after switching to metoprolol  succinate  [TY]  1213 Echo in January per my review: MPRESSIONS     1. Left ventricular ejection fraction, by estimation, is 50 to 55%. The  left ventricle has low normal function. The left ventricle has no regional  wall motion abnormalities. There is moderate asymmetric left ventricular  hypertrophy of the septal segment   (13 mm). Left ventricular diastolic parameters were  grossly normal.   2. Right ventricular systolic function is normal. The right ventricular  size is normal. There is normal pulmonary artery systolic pressure. The  estimated right ventricular systolic pressure is 31.3 mmHg.   3. The mitral valve is normal in structure. Trivial mitral valve  regurgitation. No evidence of mitral stenosis.   4. The aortic valve is tricuspid. Aortic valve regurgitation is not  visualized. No aortic stenosis is present.   5. The inferior vena cava is normal in size with greater than 50%  respiratory variability, suggesting right atrial pressure of 3 mmHg.   [TY]  1244 Appears to be A-fib RVR on the monitor in the room on my independent interpretation.  Heart rate in the 110s to the 140s.  Largely asymptomatic, but is feeling some palpitations. [TY]  1244 CBC(!) No leukocytosis to suggest systemic infection.  No anemia [TY]  1313 DG Chest 2 View IMPRESSION: Findings suggestive of mild central pulmonary vascular congestion.   Electronically Signed   By: Suzen Dials M.D.   On: 12/04/2023  12:48   [TY]  1313 CBC(!) No leukocytosis to suggest systemic infection [TY]  1313 Basic metabolic panel(!) No metabolic derangements.  Normal kidney function [TY]  1314 Troponin I (High Sensitivity): 11 ACS less likely given negative troponin [TY]  1400 Heart rate in the 80s to 90s after metoprolol .  Patient reports improvement in symptoms.  Will attempt to ambulate.  I did discuss cardioversion, patient would like to avoid if possible. [TY]  1451 Troponin I (High Sensitivity): 11 Repeat troponin negative.  [TY]  1518 Patient attempted to ambulate, however heart rate up into the 140s, started having symptoms again with some chest tightness.  No shortness of breath or desaturation per report.  Heart rate currently in the 110s.  Discussed cardioversion again, but she would like to try further rate control medications. [TY]  1521 Care signed out to afternoon team. Dispo pending repeat metoprolol /rate control.  [TY]    Clinical Course User Index [TY] Neysa Caron PARAS, DO                                 Medical Decision Making This is a 63 year old female presenting the emergency department for palpitations.  Appears to be in A-fib RVR.  Hemodynamically stable.  Per chart review does appear that she is on Eliquis  and was changed to metoprolol  succinate by cardiology back in January.  Labs with negative troponin, mildly elevated BNP, but improved compared to prior.  She is saturating well on room air.  Chest x-ray without overt pneumonia, questionable vascular congestion, but no overt pulmonary edema.  No metabolic derangements.  Normal kidney function.  No leukocytosis to suggest systemic infection.  Discussed cardioversion versus rate control.  Patient would like trial of rate control; IV metoprolol  and oral seemingly has controlled heart rate.  Will attempt to ambulate to evaluate for symptoms.  If ambulatory, patient would like to follow-up with cardiology.  Feel this is reasonable.  See ED course  for further MDM and final disposition.  Amount and/or Complexity of Data Reviewed External Data Reviewed:     Details: See ED course Labs: ordered. Decision-making details documented in ED Course. Radiology: ordered and independent interpretation performed. Decision-making details documented in ED Course.    Details: Do not appreciate obvious pneumothorax ECG/medicine tests: independent interpretation performed.    Details: A-fib RVR.  No ischemic changes  Risk Prescription  drug management. Decision regarding hospitalization.       Final diagnoses:  None    ED Discharge Orders     None          Neysa Caron PARAS, DO 12/04/23 1522

## 2023-12-04 NOTE — ED Notes (Signed)
 Hr 120s noted on EKG; CN notified

## 2023-12-04 NOTE — ED Notes (Signed)
 Patient was ambulated to the restroom in yellow. Highest heart rate was 92. Patient stated she did not have SOB, chest tightness, dizziness or pain.

## 2023-12-14 ENCOUNTER — Ambulatory Visit (HOSPITAL_COMMUNITY): Admitting: Internal Medicine

## 2023-12-16 ENCOUNTER — Ambulatory Visit (HOSPITAL_COMMUNITY)
Admission: RE | Admit: 2023-12-16 | Discharge: 2023-12-16 | Disposition: A | Discharge status: Home / Self Care | Source: Ambulatory Visit | Attending: Internal Medicine | Admitting: Internal Medicine

## 2023-12-16 ENCOUNTER — Encounter (HOSPITAL_COMMUNITY): Payer: Self-pay | Admitting: Internal Medicine

## 2023-12-16 VITALS — BP 148/76 | HR 78 | Ht 65.0 in | Wt 289.6 lb

## 2023-12-16 DIAGNOSIS — I480 Paroxysmal atrial fibrillation: Secondary | ICD-10-CM

## 2023-12-16 DIAGNOSIS — D6869 Other thrombophilia: Secondary | ICD-10-CM

## 2023-12-16 NOTE — Progress Notes (Signed)
 Primary Care Physician: Cristopher Suzen HERO, NP Primary Cardiologist: Stanly DELENA Leavens, MD Electrophysiologist: None     Referring Physician: ED     Wanda Trujillo is a 63 y.o. female with a history of HTN, HFrEF now recovered EF, NICM, former tobacco use, OSA, and atrial fibrillation who presents for consultation in the [facility]. ED visit for Afib with RVR on 12/04/23 s/p DCCV. Patient is on Eliquis  5 mg BID for stroke prevention.  On evaluation today, patient is currently in NSR. She has not had any palpitations since ED visit. She notes likely trigger for episode was the heavy meal she had the evening prior. She has been taking her Eliquis  once daily. No caffeine and rare alcohol use. Patient notes prior to moving here several years ago she was using a CPAP machine but stopped right after she moved.   Today, she denies symptoms of shortness of breath, orthopnea, PND, lower extremity edema, dizziness, presyncope, syncope, bleeding, or neurologic sequela. The patient is tolerating medications without difficulties and is otherwise without complaint today.    Atrial Fibrillation Risk Factors:  she does have symptoms or diagnosis of sleep apnea. she is not compliant with CPAP therapy.  she has a BMI of Body mass index is 48.19 kg/m.SABRA Filed Weights   12/16/23 1129  Weight: 131.4 kg    Current Outpatient Medications  Medication Sig Dispense Refill   acetaminophen  (TYLENOL ) 500 MG tablet Take 1 tablet (500 mg total) by mouth every 6 (six) hours as needed. 30 tablet 0   apixaban  (ELIQUIS ) 5 MG TABS tablet Take 1 tablet (5 mg total) by mouth 2 (two) times daily. (Patient taking differently: Take 5 mg by mouth daily at 6 (six) AM.) 180 tablet 3   atorvastatin (LIPITOR) 10 MG tablet daily.     lidocaine  (LIDODERM ) 5 % as needed (pain).     lisinopril -hydrochlorothiazide (ZESTORETIC) 20-12.5 MG tablet Take 1 tablet by mouth daily.      methocarbamol  (ROBAXIN ) 500 MG tablet Take 1 tablet (500 mg total) by mouth 2 (two) times daily. 20 tablet 0   metoprolol  succinate (TOPROL  XL) 50 MG 24 hr tablet Take 1 tablet (50 mg total) by mouth daily. Take with or immediately following a meal. 90 tablet 3   No current facility-administered medications for this encounter.    Atrial Fibrillation Management history:  Previous antiarrhythmic drugs:  Previous cardioversions: 2017, 12/04/23 Previous ablations: none Anticoagulation history: Eliquis    ROS- All systems are reviewed and negative except as per the HPI above.  Physical Exam: BP (!) 148/76   Pulse 78   Ht 5' 5 (1.651 m)   Wt 131.4 kg   LMP 05/25/2015   BMI 48.19 kg/m   GEN: Well nourished, well developed in no acute distress NECK: No JVD; No carotid bruits CARDIAC: Regular rate and rhythm, no murmurs, rubs, gallops RESPIRATORY:  Clear to auscultation without rales, wheezing or rhonchi  ABDOMEN: Soft, non-tender, non-distended EXTREMITIES:  No edema; No deformity   EKG today demonstrates  Vent. rate 78 BPM PR interval 162 ms QRS duration 86 ms QT/QTcB 386/440 ms P-R-T axes 43 18 30 Sinus rhythm with marked sinus arrhythmia Cannot rule out Anterior infarct , age undetermined Abnormal ECG When compared with ECG of 04-Dec-2023 16:41, PREVIOUS ECG IS PRESENT  Echo 03/12/23 demonstrated  1. Left ventricular ejection fraction, by estimation, is 50 to 55%. The  left ventricle has low normal function. The left ventricle has no regional  wall motion abnormalities. There is moderate asymmetric left ventricular  hypertrophy of the septal segment   (13 mm). Left ventricular diastolic parameters were grossly normal.   2. Right ventricular systolic function is normal. The right ventricular  size is normal. There is normal pulmonary artery systolic pressure. The  estimated right ventricular systolic pressure is 31.3 mmHg.   3. The mitral valve is normal in structure.  Trivial mitral valve  regurgitation. No evidence of mitral stenosis.   4. The aortic valve is tricuspid. Aortic valve regurgitation is not  visualized. No aortic stenosis is present.   5. The inferior vena cava is normal in size with greater than 50%  respiratory variability, suggesting right atrial pressure of 3 mmHg.   ASSESSMENT & PLAN CHA2DS2-VASc Score = 3  The patient's score is based upon: CHF History: 1 HTN History: 1 Diabetes History: 0 Stroke History: 0 Vascular Disease History: 0 Age Score: 0 Gender Score: 1       ASSESSMENT AND PLAN: Paroxysmal Atrial Fibrillation (ICD10:  I48.0) The patient's CHA2DS2-VASc score is 3, indicating a 3.2% annual risk of stroke.    Patient is currently in NSR. S/p DCCV in ED on 10/10. Continue Toprol  50 mg daily. Refer for sleep study to get back on CPAP. We discussed AAD therapy going forward if increased Afib burden is noted.   Secondary Hypercoagulable State (ICD10:  D68.69) The patient is at significant risk for stroke/thromboembolism based upon her CHA2DS2-VASc Score of 3.  Continue Apixaban  (Eliquis ).  Continue Eliquis .    Follow up 6 months Afib clinic.   Terra Pac, [facility]  Afib Clinic [address] [place], [place] [address] [phone]

## 2024-06-22 ENCOUNTER — Ambulatory Visit (HOSPITAL_COMMUNITY): Admitting: Internal Medicine
